# Patient Record
Sex: Female | Born: 1980 | Hispanic: No | Marital: Married | Smoking: Never smoker
Health system: Southern US, Community
[De-identification: ages and names within clinical notes are randomized; demographics above are authoritative.]

## PROBLEM LIST (undated history)

## (undated) DIAGNOSIS — D649 Anemia, unspecified: Secondary | ICD-10-CM

## (undated) HISTORY — DX: Anemia, unspecified: D64.9

## (undated) HISTORY — PX: NO PAST SURGERIES: SHX2092

---

## 2008-10-04 ENCOUNTER — Ambulatory Visit (HOSPITAL_COMMUNITY): Admission: RE | Admit: 2008-10-04 | Discharge: 2008-10-04 | Payer: Self-pay | Admitting: Obstetrics

## 2008-11-02 ENCOUNTER — Ambulatory Visit (HOSPITAL_COMMUNITY): Admission: RE | Admit: 2008-11-02 | Discharge: 2008-11-02 | Payer: Self-pay | Admitting: Obstetrics & Gynecology

## 2008-12-14 ENCOUNTER — Ambulatory Visit (HOSPITAL_COMMUNITY): Admission: RE | Admit: 2008-12-14 | Discharge: 2008-12-14 | Payer: Self-pay | Admitting: Obstetrics & Gynecology

## 2009-02-01 ENCOUNTER — Inpatient Hospital Stay (HOSPITAL_COMMUNITY): Admission: AD | Admit: 2009-02-01 | Discharge: 2009-02-04 | Payer: Self-pay | Admitting: Obstetrics & Gynecology

## 2009-02-02 ENCOUNTER — Encounter: Payer: Self-pay | Admitting: Obstetrics & Gynecology

## 2009-08-05 ENCOUNTER — Emergency Department (HOSPITAL_COMMUNITY): Admission: EM | Admit: 2009-08-05 | Discharge: 2009-08-05 | Payer: Self-pay | Admitting: Emergency Medicine

## 2010-09-17 ENCOUNTER — Encounter
Admission: RE | Admit: 2010-09-17 | Discharge: 2010-09-17 | Payer: Self-pay | Source: Home / Self Care | Attending: Infectious Diseases | Admitting: Infectious Diseases

## 2010-09-28 NOTE — L&D Delivery Note (Signed)
Delivery Note At 11:03 PM a viable female was delivered via Vaginal, Spontaneous Delivery by EMA  APGAR: 7, 7; weight 6 lb 1.2 oz (2755 g).   Placenta status: Intact, Spontaneous by RN.    Anesthesia: None  Episiotomy: None Lacerations: 2nd degree Suture Repair: 2.0 vicryl rapide Est. Blood Loss (mL): Unknown  Mom to postpartum.  Baby to nursery-stable.  JACKSON-MOORE,[name] A 09/04/2011, 12:48 AM

## 2010-12-31 LAB — CBC
HCT: 37.8 % (ref 36.0–46.0)
Hemoglobin: 12.8 g/dL (ref 12.0–15.0)
MCHC: 33.9 g/dL (ref 30.0–36.0)
MCV: 86.4 fL (ref 78.0–100.0)
Platelets: 242 10*3/uL (ref 150–400)
RBC: 4.38 MIL/uL (ref 3.87–5.11)
RDW: 13.5 % (ref 11.5–15.5)
WBC: 8.6 10*3/uL (ref 4.0–10.5)

## 2010-12-31 LAB — POCT I-STAT, CHEM 8
BUN: 16 mg/dL (ref 6–23)
Chloride: 104 mEq/L (ref 96–112)
Creatinine, Ser: 0.7 mg/dL (ref 0.4–1.2)
Sodium: 141 mEq/L (ref 135–145)
TCO2: 26 mmol/L (ref 0–100)

## 2010-12-31 LAB — TSH: TSH: 2.071 u[IU]/mL (ref 0.350–4.500)

## 2011-01-06 LAB — CBC
HCT: 31.2 % — ABNORMAL LOW (ref 36.0–46.0)
HCT: 35.8 % — ABNORMAL LOW (ref 36.0–46.0)
Hemoglobin: 12.4 g/dL (ref 12.0–15.0)
MCHC: 34.6 g/dL (ref 30.0–36.0)
MCHC: 34.8 g/dL (ref 30.0–36.0)
MCV: 88.7 fL (ref 78.0–100.0)
MCV: 88.9 fL (ref 78.0–100.0)
Platelets: 209 10*3/uL (ref 150–400)
RBC: 4.03 MIL/uL (ref 3.87–5.11)
RDW: 15.1 % (ref 11.5–15.5)

## 2011-01-06 LAB — RPR: RPR Ser Ql: NONREACTIVE

## 2011-09-02 ENCOUNTER — Other Ambulatory Visit: Payer: Self-pay | Admitting: Obstetrics & Gynecology

## 2011-09-02 ENCOUNTER — Ambulatory Visit (HOSPITAL_COMMUNITY)
Admission: RE | Admit: 2011-09-02 | Discharge: 2011-09-02 | Disposition: A | Discharge status: Home / Self Care | Payer: Medicaid Other | Source: Ambulatory Visit | Attending: Obstetrics & Gynecology | Admitting: Obstetrics & Gynecology

## 2011-09-02 DIAGNOSIS — O368390 Maternal care for abnormalities of the fetal heart rate or rhythm, unspecified trimester, not applicable or unspecified: Secondary | ICD-10-CM | POA: Insufficient documentation

## 2011-09-02 DIAGNOSIS — O288 Other abnormal findings on antenatal screening of mother: Secondary | ICD-10-CM

## 2011-09-02 DIAGNOSIS — O368190 Decreased fetal movements, unspecified trimester, not applicable or unspecified: Secondary | ICD-10-CM

## 2011-09-03 ENCOUNTER — Inpatient Hospital Stay (HOSPITAL_COMMUNITY)
Admission: AD | Admit: 2011-09-03 | Discharge: 2011-09-06 | DRG: 775 | Disposition: A | Discharge status: Home / Self Care | Payer: Medicaid Other | Source: Ambulatory Visit | Attending: Obstetrics & Gynecology | Admitting: Obstetrics & Gynecology

## 2011-09-03 ENCOUNTER — Inpatient Hospital Stay (HOSPITAL_COMMUNITY)
Admission: AD | Admit: 2011-09-03 | Discharge: 2011-09-03 | Disposition: A | Discharge status: Home / Self Care | Payer: Medicaid Other | Source: Ambulatory Visit | Attending: Obstetrics & Gynecology | Admitting: Obstetrics & Gynecology

## 2011-09-03 ENCOUNTER — Encounter (HOSPITAL_COMMUNITY): Payer: Self-pay | Admitting: *Deleted

## 2011-09-03 DIAGNOSIS — O479 False labor, unspecified: Secondary | ICD-10-CM | POA: Insufficient documentation

## 2011-09-03 MED ORDER — ZOLPIDEM TARTRATE 10 MG PO TABS
10.0000 mg | ORAL_TABLET | Freq: Once | ORAL | Status: AC
  Administered 2011-09-03: 10 mg via ORAL
  Filled 2011-09-03: qty 1

## 2011-09-03 NOTE — Progress Notes (Signed)
38.4 WKS G3P2 SVE 3/80/-3 UC'S X2 IN 30 MIN. REACTIVE STRIP WITH X2 EARLIES. ORDERS GIVEN TO D/C HOME WITH LABOR AND KICK COUNTS. ORDERS GIVEN FOR AMBIEN 10 MG PO FOR PAIN.

## 2011-09-04 ENCOUNTER — Encounter (HOSPITAL_COMMUNITY): Payer: Self-pay

## 2011-09-04 LAB — CBC
HCT: 33.7 % — ABNORMAL LOW (ref 36.0–46.0)
MCHC: 32.9 g/dL (ref 30.0–36.0)
MCV: 80.2 fL (ref 78.0–100.0)
Platelets: 242 10*3/uL (ref 150–400)
RDW: 17.4 % — ABNORMAL HIGH (ref 11.5–15.5)
WBC: 19.8 10*3/uL — ABNORMAL HIGH (ref 4.0–10.5)

## 2011-09-04 MED ORDER — DIPHENHYDRAMINE HCL 25 MG PO CAPS: 25.0000 mg | ORAL_CAPSULE | Freq: Four times a day (QID) | ORAL | Status: DC | PRN

## 2011-09-04 MED ORDER — SENNOSIDES-DOCUSATE SODIUM 8.6-50 MG PO TABS
2.0000 | ORAL_TABLET | Freq: Every day | ORAL | Status: DC
  Administered 2011-09-04 – 2011-09-05 (×2): 2 via ORAL

## 2011-09-04 MED ORDER — DIBUCAINE 1 % RE OINT: 1.0000 "application " | TOPICAL_OINTMENT | RECTAL | Status: DC | PRN

## 2011-09-04 MED ORDER — MEASLES, MUMPS & RUBELLA VAC ~~LOC~~ INJ: 0.5000 mL | INJECTION | Freq: Once | SUBCUTANEOUS | Status: DC

## 2011-09-04 MED ORDER — LIDOCAINE HCL (PF) 1 % IJ SOLN: 30.0000 mL | INTRAMUSCULAR | Status: DC | PRN

## 2011-09-04 MED ORDER — ONDANSETRON HCL 4 MG PO TABS: 4.0000 mg | ORAL_TABLET | ORAL | Status: DC | PRN

## 2011-09-04 MED ORDER — LACTATED RINGERS IV SOLN: INTRAVENOUS | Status: DC

## 2011-09-04 MED ORDER — TETANUS-DIPHTH-ACELL PERTUSSIS 5-2.5-18.5 LF-MCG/0.5 IM SUSP: 0.5000 mL | Freq: Once | INTRAMUSCULAR | Status: DC

## 2011-09-04 MED ORDER — LACTATED RINGERS IV SOLN: 500.0000 mL | INTRAVENOUS | Status: DC | PRN

## 2011-09-04 MED ORDER — OXYTOCIN 10 UNIT/ML IJ SOLN
10.0000 [IU] | Freq: Once | INTRAMUSCULAR | Status: AC
  Administered 2011-09-04: 10 [IU] via INTRAMUSCULAR

## 2011-09-04 MED ORDER — MAGNESIUM HYDROXIDE 400 MG/5ML PO SUSP: 30.0000 mL | ORAL | Status: DC | PRN

## 2011-09-04 MED ORDER — OXYTOCIN 10 UNIT/ML IJ SOLN
INTRAMUSCULAR | Status: AC
  Filled 2011-09-04: qty 1

## 2011-09-04 MED ORDER — VARICELLA VIRUS VACCINE LIVE 1350 PFU/0.5ML IJ SUSR: 0.5000 mL | INTRAMUSCULAR | Status: DC | PRN

## 2011-09-04 MED ORDER — FERROUS SULFATE 325 (65 FE) MG PO TABS
325.0000 mg | ORAL_TABLET | Freq: Two times a day (BID) | ORAL | Status: DC
  Administered 2011-09-04 – 2011-09-06 (×3): 325 mg via ORAL
  Filled 2011-09-04 (×3): qty 1

## 2011-09-04 MED ORDER — OXYCODONE-ACETAMINOPHEN 5-325 MG PO TABS: 2.0000 | ORAL_TABLET | ORAL | Status: DC | PRN

## 2011-09-04 MED ORDER — WITCH HAZEL-GLYCERIN EX PADS: 1.0000 "application " | MEDICATED_PAD | CUTANEOUS | Status: DC | PRN

## 2011-09-04 MED ORDER — CITRIC ACID-SODIUM CITRATE 334-500 MG/5ML PO SOLN: 30.0000 mL | ORAL | Status: DC | PRN

## 2011-09-04 MED ORDER — OXYCODONE-ACETAMINOPHEN 5-325 MG PO TABS: 1.0000 | ORAL_TABLET | ORAL | Status: DC | PRN

## 2011-09-04 MED ORDER — HYDROXYZINE HCL 50 MG PO TABS: 50.0000 mg | ORAL_TABLET | Freq: Four times a day (QID) | ORAL | Status: DC | PRN

## 2011-09-04 MED ORDER — INFLUENZA VIRUS VACC SPLIT PF IM SUSP: 0.5000 mL | INTRAMUSCULAR | Status: DC | PRN

## 2011-09-04 MED ORDER — ONDANSETRON HCL 4 MG/2ML IJ SOLN: 4.0000 mg | INTRAMUSCULAR | Status: DC | PRN

## 2011-09-04 MED ORDER — BENZOCAINE-MENTHOL 20-0.5 % EX AERO
INHALATION_SPRAY | CUTANEOUS | Status: AC
  Administered 2011-09-04: 1 via TOPICAL
  Filled 2011-09-04: qty 56

## 2011-09-04 MED ORDER — PRENATAL PLUS 27-1 MG PO TABS
1.0000 | ORAL_TABLET | Freq: Every day | ORAL | Status: DC
  Administered 2011-09-04 – 2011-09-06 (×3): 1 via ORAL
  Filled 2011-09-04 (×3): qty 1

## 2011-09-04 MED ORDER — BUTORPHANOL TARTRATE 2 MG/ML IJ SOLN: 1.0000 mg | INTRAMUSCULAR | Status: DC | PRN

## 2011-09-04 MED ORDER — IBUPROFEN 600 MG PO TABS: 600.0000 mg | ORAL_TABLET | Freq: Four times a day (QID) | ORAL | Status: DC | PRN

## 2011-09-04 MED ORDER — HYDROXYZINE HCL 50 MG/ML IM SOLN: 50.0000 mg | Freq: Four times a day (QID) | INTRAMUSCULAR | Status: DC | PRN

## 2011-09-04 MED ORDER — LANOLIN HYDROUS EX OINT: TOPICAL_OINTMENT | CUTANEOUS | Status: DC | PRN

## 2011-09-04 MED ORDER — ONDANSETRON HCL 4 MG/2ML IJ SOLN: 4.0000 mg | Freq: Four times a day (QID) | INTRAMUSCULAR | Status: DC | PRN

## 2011-09-04 MED ORDER — BENZOCAINE-MENTHOL 20-0.5 % EX AERO
1.0000 "application " | INHALATION_SPRAY | CUTANEOUS | Status: DC | PRN
  Administered 2011-09-04: 1 via TOPICAL

## 2011-09-04 MED ORDER — ACETAMINOPHEN 325 MG PO TABS: 650.0000 mg | ORAL_TABLET | ORAL | Status: DC | PRN

## 2011-09-04 MED ORDER — IBUPROFEN 600 MG PO TABS
600.0000 mg | ORAL_TABLET | Freq: Four times a day (QID) | ORAL | Status: DC
  Administered 2011-09-04 – 2011-09-06 (×10): 600 mg via ORAL
  Filled 2011-09-04 (×9): qty 1

## 2011-09-04 MED ORDER — LIDOCAINE HCL (PF) 1 % IJ SOLN
30.0000 mL | Freq: Once | INTRAMUSCULAR | Status: AC
  Administered 2011-09-04: 30 mL
  Filled 2011-09-04: qty 30

## 2011-09-04 MED ORDER — MEDROXYPROGESTERONE ACETATE 150 MG/ML IM SUSP: 150.0000 mg | INTRAMUSCULAR | Status: DC | PRN

## 2011-09-04 NOTE — Progress Notes (Signed)
UR Chart review completed.  

## 2011-09-04 NOTE — H&P (Signed)
Jessica Ballard is a 30 y.o. female presenting s/p NSVD. Maternal Medical History:  Reason for admission: Reason for Admission:   nauseaBIBEMS s/p NSVD  Prenatal complications: no prenatal complications   OB History    Grav Para Term Preterm Abortions TAB SAB Ect Mult Living   3 3 3       3      Past Medical History  Diagnosis Date  . No pertinent past medical history    Past Surgical History  Procedure Date  . No past surgeries    Family History: family history is not on file. Social History:  reports that she has never smoked. She does not have any smokeless tobacco history on file. She reports that she does not drink alcohol or use illicit drugs.  Review of Systems  Constitutional: Negative for fever.  Eyes: Negative for blurred vision.  Respiratory: Negative for shortness of breath.   Gastrointestinal: Negative for nausea and vomiting.  Skin: Negative for rash.  Neurological: Negative for headaches.      Blood pressure 113/92, pulse 82, resp. rate 20, unknown if currently breastfeeding. Maternal Exam:  Introitus: See delivery note.     Physical Exam  Constitutional: She appears well-developed.  HENT:  Head: Normocephalic.  Neck: Neck supple. No thyromegaly present.  Cardiovascular: Normal rate and regular rhythm.   Respiratory: Breath sounds normal.  GI: Soft. Bowel sounds are normal.  Skin: No rash noted.    Prenatal labs: ABO, Rh:   Antibody:   Rubella:   RPR:    HBsAg:    HIV:    GBS:     Assessment/Plan: Multipara at term s/p NSVD. Mother/Infant stable  Admit Routine postpartum care   JACKSON-MOORE,Jarrid Lienhard A 09/04/2011, 12:51 AM

## 2011-09-04 NOTE — Progress Notes (Signed)
Post Partum Day 1 Subjective: no complaints, up ad lib, voiding, tolerating PO, + flatus and breastfeeding.   Objective: Blood pressure 96/57, pulse 94, temperature 97.8 F (36.6 C), temperature source Oral, resp. rate 16, unknown if currently breastfeeding.  Physical Exam:  General: alert, cooperative, appears stated age and no distress Lochia: appropriate Uterine Fundus: firm Incision: healing well, no significant drainage, no dehiscence, no significant erythema (perineal laceration). DVT Evaluation: No evidence of DVT seen on physical exam. Negative Homan's sign. No cords or calf tenderness.   Basename 09/04/11 0537  HGB 11.1*  HCT 33.7*    Assessment/Plan: Plan for discharge tomorrow and Breastfeeding   LOS: 1 day   Anice Paganini CNM 09/04/2011, 11:09 AM

## 2011-09-05 NOTE — Discharge Summary (Signed)
Obstetric Discharge Summary Reason for Admission: onset of labor Prenatal Procedures: none Intrapartum Procedures: spontaneous vaginal delivery Postpartum Procedures: none Complications-Operative and Postpartum: none Hemoglobin  Date Value Range Status  09/04/2011 11.1* 12.0-15.0 (g/dL) Final     HCT  Date Value Range Status  09/04/2011 33.7* 36.0-46.0 (%) Final    Discharge Diagnoses: Term Pregnancy-delivered  Discharge Information: Date: 09/05/2011 Activity: pelvic rest Diet: routine Medications: Percocet Condition: stable Instructions: refer to practice specific booklet Discharge to: home Follow-up Information    Follow up with Antionette Char A, MD. Call in 6 weeks.   Contact information:   [address], [address] [place] [address] [phone]          Newborn Data: Live born female  Birth Weight: 6 lb 1.2 oz (2755 g) APGAR: 7, 7  Home with mother.  Jessica Ballard A 09/05/2011, 8:15 AM

## 2011-09-06 NOTE — Discharge Summary (Signed)
  Postpartum day 3 Vital signs will Fundus firm Legs negative Discharge was canceled yesterday because butoconazole him

## 2011-09-08 ENCOUNTER — Encounter (HOSPITAL_COMMUNITY): Payer: Self-pay | Admitting: *Deleted

## 2012-01-06 ENCOUNTER — Ambulatory Visit (INDEPENDENT_AMBULATORY_CARE_PROVIDER_SITE_OTHER): Payer: Self-pay | Admitting: Family Medicine

## 2012-01-06 ENCOUNTER — Encounter: Payer: Self-pay | Admitting: Family Medicine

## 2012-01-06 VITALS — BP 102/69 | HR 70 | Temp 97.7°F | Ht 66.0 in | Wt 114.0 lb

## 2012-01-06 DIAGNOSIS — Z833 Family history of diabetes mellitus: Secondary | ICD-10-CM

## 2012-01-06 DIAGNOSIS — L989 Disorder of the skin and subcutaneous tissue, unspecified: Secondary | ICD-10-CM | POA: Insufficient documentation

## 2012-01-06 DIAGNOSIS — D170 Benign lipomatous neoplasm of skin and subcutaneous tissue of head, face and neck: Secondary | ICD-10-CM

## 2012-01-06 DIAGNOSIS — D1739 Benign lipomatous neoplasm of skin and subcutaneous tissue of other sites: Secondary | ICD-10-CM

## 2012-01-06 DIAGNOSIS — L659 Nonscarring hair loss, unspecified: Secondary | ICD-10-CM

## 2012-01-06 DIAGNOSIS — R42 Dizziness and giddiness: Secondary | ICD-10-CM

## 2012-01-06 HISTORY — DX: Disorder of the skin and subcutaneous tissue, unspecified: L98.9

## 2012-01-06 NOTE — Assessment & Plan Note (Addendum)
Hairloss for many years. Normal exam. No scarring or brittleness. Negative pull test. Most likely subjective but will obtain CBC, CMET and TSH to evaluate nutritional status and thyroid function.

## 2012-01-06 NOTE — Assessment & Plan Note (Signed)
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

## 2012-01-06 NOTE — Assessment & Plan Note (Signed)
Normal screening test during pregnancies. Plan to check fasting blood sugar on CMET when patient returns for blood draw.

## 2012-01-06 NOTE — Assessment & Plan Note (Signed)
Normal exam and orthostatic vital signs. Suspect anemia may have worsened. Patient with documented normocytic anemia in 12/12. Taking PNV with iron. Will check anemia panel.

## 2012-01-06 NOTE — Patient Instructions (Signed)
Jessica Ballard,   Thank you for coming in to see me today. It was a pleasure to meet you.  Please return at your earliest convenience for a lab visit for blood work.   After you have the blood work done I will call you with the results/send a letter if all normal.   For now: continue your vitamin, rest, daily exercise to reduce stress (15 mins) and consume a well rounded diet.   Dr. Armen Pickup

## 2012-01-06 NOTE — Progress Notes (Signed)
Subjective:     Patient ID: Jessica Ballard, female   DOB: October 14, 1980, 31 y.o.   MRN: [id number]  HPI 31 yo new patient presents to establish primary care and to discuss the following issues:  1. Lumps in head: painless lumps x 2 on L side of head. Present for 10 years unchanging. Pain less.  2. Lightheaded and weak x 3-4 days: admits to poor appetite, fatigue, feeling intermittently lightheaded and dizzy and not having time to rest with 3 young children and working at her [employer]. Denies visual changes, CP, palpitations, SOB, and vaginal bleeding.  3. Hair loss: x 2 years. Thinning hair. No worse or better with pregnancy. No family history of alopecia in close female relative. No nail changes (thinning or brittle). Uses Vaseline and oils as products. Denies use of harsh chemicals.  4 Last received care at [facility]  6 week postpartum visit on 11/04/11. Breastfeeding. Bonding well with baby. Takes PNV every other day due to constipation.  5. Family Hx of DM2: normal 1 hr glucola test with pregnancies. No polyuria, polydipsia, weight changes.   Past Medical History  Diagnosis Date  . Anemia     Review of Systems As per HPI    Objective:   Physical Exam BP 102/69  Pulse 70  Temp(Src) 97.7 F (36.5 C) (Oral)  Ht 5\' 6"  (1.676 m)  Wt 114 lb (51.71 kg)  BMI 18.40 kg/m2  Breastfeeding? Yes General appearance: alert, cooperative and no distress Head: Normocephalic, without obvious abnormality, atraumatic, two 1x1 cm firm, immobile, nontender scalp nodules in L parietal scalp. Negative pull test (no hair came out with pulling).  Neck: no adenopathy, no carotid bruit, no JVD, supple, symmetrical, trachea midline and thyroid not enlarged, symmetric, no tenderness/mass/nodules Lungs: clear to auscultation bilaterally Heart: regular rate and rhythm, S1, S2 normal, no murmur, click, rub or gallop Pulses: 2+ and symmetric Skin: Skin color, texture, turgor normal. No rashes or  lesions Neurologic: Grossly normal    Assessment and Plan:  See Problem List

## 2012-02-12 ENCOUNTER — Telehealth: Payer: Self-pay | Admitting: Family Medicine

## 2012-02-12 NOTE — Telephone Encounter (Signed)
Patient states she started with fever, back ache, teeth hurt, headache. She has not actually taken temp but feels feverish .  Advised that she probably needs to be seen to be evaluated and recommended she go to Urgent Care . States she does not want to be seen  just wants to know what she can take for fever and pain. Recommened to take Tylenol. Make sure drinking plenty of fluids.    States the baby had temp yesterday of 100 axillary but none today.  Drinking well. Has a runny nose.  Advised if develops fever needs to be seen.Marland Kitchen

## 2012-02-12 NOTE — Telephone Encounter (Signed)
Pt called to say she has the flu and is breast feeding.  Wants to know what she can take. She states that her 19 mo old has it also.

## 2013-01-12 ENCOUNTER — Encounter: Payer: Medicaid Other | Admitting: Family Medicine

## 2013-01-27 ENCOUNTER — Encounter: Payer: Medicaid Other | Admitting: Family Medicine

## 2013-01-30 ENCOUNTER — Encounter: Payer: Medicaid Other | Admitting: Family Medicine

## 2013-03-10 ENCOUNTER — Encounter: Payer: Medicaid Other | Admitting: Family Medicine

## 2013-03-14 ENCOUNTER — Ambulatory Visit (INDEPENDENT_AMBULATORY_CARE_PROVIDER_SITE_OTHER): Payer: Medicaid Other | Admitting: Family Medicine

## 2013-03-14 ENCOUNTER — Encounter: Payer: Self-pay | Admitting: Family Medicine

## 2013-03-14 VITALS — BP 107/75 | HR 73 | Ht 66.25 in | Wt 107.0 lb

## 2013-03-14 DIAGNOSIS — Z833 Family history of diabetes mellitus: Secondary | ICD-10-CM

## 2013-03-14 DIAGNOSIS — R636 Underweight: Secondary | ICD-10-CM

## 2013-03-14 DIAGNOSIS — Z30011 Encounter for initial prescription of contraceptive pills: Secondary | ICD-10-CM | POA: Insufficient documentation

## 2013-03-14 DIAGNOSIS — R634 Abnormal weight loss: Secondary | ICD-10-CM

## 2013-03-14 DIAGNOSIS — Z3009 Encounter for other general counseling and advice on contraception: Secondary | ICD-10-CM

## 2013-03-14 DIAGNOSIS — D509 Iron deficiency anemia, unspecified: Secondary | ICD-10-CM

## 2013-03-14 DIAGNOSIS — L659 Nonscarring hair loss, unspecified: Secondary | ICD-10-CM

## 2013-03-14 HISTORY — DX: Abnormal weight loss: R63.4

## 2013-03-14 LAB — IBC PANEL
%SAT: 4 % — ABNORMAL LOW (ref 20–55)
UIBC: 395 ug/dL (ref 125–400)

## 2013-03-14 LAB — CBC
MCV: 81.8 fL (ref 78.0–100.0)
Platelets: 215 10*3/uL (ref 150–400)
RBC: 4.44 MIL/uL (ref 3.87–5.11)
WBC: 4.9 10*3/uL (ref 4.0–10.5)

## 2013-03-14 LAB — FERRITIN: Ferritin: 2 ng/mL — ABNORMAL LOW (ref 10–291)

## 2013-03-14 MED ORDER — ETONOGESTREL-ETHINYL ESTRADIOL 0.12-0.015 MG/24HR VA RING: VAGINAL_RING | VAGINAL | Status: DC

## 2013-03-14 NOTE — Progress Notes (Signed)
Subjective:     Patient ID: Jessica Ballard, female   DOB: 05-03-1981, 32 y.o.   MRN: [id number]  HPI 32 year old female presents with her husband for a physical and to discuss the following:  #1 Hair loss: Patient is here for the past year. The house is painless and not associated with scarring. She has no known family history of health. Her menstrual periods have been normal and she has no female pattern hair growth (facial,  chest or back hair).   #2 weight loss: Patient has experienced 7 pounds unintentional weight loss since her last office visit 2 months ago. In this time she has started working at [employer]. She reports her appetite is good but she just does not have time to eat.  #3 contraception: Patient currently takes active with her husband. She's not using contraception. She is interested in options does not want anything injected or inserted.  #4 health maintenance: Last Pap smear was 2 years ago normal.  Review of Systems As per HPI  Patient Information Form: Screening and ROS  AUDIT-C Score: 0 Do you feel safe in relationships? yes PHQ-2:negative  Review of Symptoms  General:  Positive for unexplained weight loss. Negative for fever Skin: Negative for new or changing mole, sore that won't heal HEENT: Negative for trouble hearing, trouble seeing, ringing in ears, mouth sores, hoarseness, change in voice, dysphagia. CV:  Negative for chest pain, dyspnea, edema, palpitations Resp: Negative for cough, dyspnea, hemoptysis GI: Negative for nausea, vomiting, diarrhea, constipation, abdominal pain, melena, hematochezia. GU: Negative for dysuria, incontinence, urinary hesitance, hematuria, vaginal or penile discharge, polyuria, sexual difficulty, lumps in testicle or breasts MSK: Negative for muscle cramps or aches, joint pain or swelling Neuro: Negative for headaches, weakness, numbness, dizziness, passing out/fainting Psych: Negative for depression, anxiety, memory problems     Objective:   Physical Exam BP 107/75  Pulse 73  Ht 5' 6.25" (1.683 m)  Wt 107 lb (48.535 kg)  BMI 17.14 kg/m2 General appearance: alert, cooperative and no distress Head: Normocephalic, without obvious abnormality, atraumatic hair thinning most notable at L>R posterior scalp. Negative pull test (no hair came out with pulling).  Neck: no adenopathy, no carotid bruit, no JVD, supple, symmetrical, trachea midline and thyroid not enlarged, symmetric, no tenderness/mass/nodules Lungs: clear to auscultation bilaterally Heart: regular rate and rhythm, S1, S2 normal, no murmur, click, rub or gallop Extremities: extremities normal, atraumatic, no cyanosis or edema    Assessment and Plan:

## 2013-03-14 NOTE — Patient Instructions (Signed)
Jessica Ballard,  Thank you for coming in today. Please start nuvaring for birth control.  I will be in touch with blood work and let you know if you need supplements.   Dr. Armen Pickup

## 2013-03-17 ENCOUNTER — Telehealth: Payer: Self-pay | Admitting: Family Medicine

## 2013-03-17 ENCOUNTER — Encounter: Payer: Self-pay | Admitting: Family Medicine

## 2013-03-17 DIAGNOSIS — R636 Underweight: Secondary | ICD-10-CM

## 2013-03-17 DIAGNOSIS — D509 Iron deficiency anemia, unspecified: Secondary | ICD-10-CM | POA: Insufficient documentation

## 2013-03-17 HISTORY — DX: Underweight: R63.6

## 2013-03-17 MED ORDER — FERROUS SULFATE 325 (65 FE) MG PO TABS: 325.0000 mg | ORAL_TABLET | Freq: Three times a day (TID) | ORAL | Status: DC

## 2013-03-17 NOTE — Telephone Encounter (Signed)
Called patient left brief voice mail. The patient has a iron deficiency. Prescribed oral iron therapy. I have sent a letter detailing my instructions.  Patient advised to call back if she has questions once she receives the letter.

## 2013-03-17 NOTE — Assessment & Plan Note (Signed)
A: normal TSH. IDA, suspect chronic IDA is contributing to hair loss. P: replete iron.

## 2013-03-17 NOTE — Telephone Encounter (Signed)
Pt has questions about blood work, she would like a copy of the blood work Please  call

## 2013-03-17 NOTE — Assessment & Plan Note (Signed)
Screening A1c is normal.

## 2013-03-17 NOTE — Assessment & Plan Note (Signed)
A: Jessica Ballard. Suspect chronic Jessica Ballard. Patient reports that she did require iron supplementation during her last pregnancy. P: I will replete her iron orally. A modest her rate response to oral supplementation by checking a reticulocyte count in the CBC in 8-12 weeks. I suspect that her hair loss is also in part due to chronic iron deficiency anemia.

## 2013-03-17 NOTE — Telephone Encounter (Signed)
Someone called patient per patient but she could not hear the message.  Please call patient with lab results.  She has lots of questions.  Saylor Sheckler, Darlyne Russian, CMA

## 2013-03-17 NOTE — Assessment & Plan Note (Signed)
A: patient has increased work and is skipping meals. P: we discussed plan for packing meals vs taking meal supplements when she is short on time.

## 2013-03-17 NOTE — Assessment & Plan Note (Signed)
A: discussed options P: nuva ring

## 2013-03-20 NOTE — Telephone Encounter (Signed)
Called patient back. Reiterated info from last voicemail. Informed her that results letter sent. She is to call with questions.

## 2014-07-30 ENCOUNTER — Encounter: Payer: Self-pay | Admitting: Family Medicine

## 2015-03-27 ENCOUNTER — Encounter (HOSPITAL_COMMUNITY): Payer: Self-pay | Admitting: Family Medicine

## 2015-03-27 ENCOUNTER — Emergency Department (HOSPITAL_COMMUNITY)
Admission: EM | Admit: 2015-03-27 | Discharge: 2015-03-27 | Disposition: A | Discharge status: Home / Self Care | Payer: 59 | Attending: Emergency Medicine | Admitting: Emergency Medicine

## 2015-03-27 DIAGNOSIS — Y998 Other external cause status: Secondary | ICD-10-CM | POA: Insufficient documentation

## 2015-03-27 DIAGNOSIS — Z880 Allergy status to penicillin: Secondary | ICD-10-CM | POA: Insufficient documentation

## 2015-03-27 DIAGNOSIS — Y9389 Activity, other specified: Secondary | ICD-10-CM | POA: Insufficient documentation

## 2015-03-27 DIAGNOSIS — Z79899 Other long term (current) drug therapy: Secondary | ICD-10-CM | POA: Insufficient documentation

## 2015-03-27 DIAGNOSIS — T63481A Toxic effect of venom of other arthropod, accidental (unintentional), initial encounter: Secondary | ICD-10-CM | POA: Diagnosis not present

## 2015-03-27 DIAGNOSIS — R21 Rash and other nonspecific skin eruption: Secondary | ICD-10-CM | POA: Diagnosis present

## 2015-03-27 DIAGNOSIS — Y9289 Other specified places as the place of occurrence of the external cause: Secondary | ICD-10-CM | POA: Insufficient documentation

## 2015-03-27 DIAGNOSIS — T7840XA Allergy, unspecified, initial encounter: Secondary | ICD-10-CM | POA: Insufficient documentation

## 2015-03-27 DIAGNOSIS — X58XXXA Exposure to other specified factors, initial encounter: Secondary | ICD-10-CM | POA: Insufficient documentation

## 2015-03-27 MED ORDER — EPINEPHRINE 0.3 MG/0.3ML IJ SOAJ: 0.3000 mg | Freq: Once | INTRAMUSCULAR | Status: DC

## 2015-03-27 NOTE — ED Notes (Signed)
Pt here for RLE redness and swelling. sts she was in her garden and felt something sting her. Now pt having redness and swelling from right mid calf to foot and swelling.

## 2015-03-27 NOTE — Discharge Instructions (Signed)

## 2015-03-27 NOTE — ED Provider Notes (Signed)
CSN: [id number]     Arrival date & time 03/27/15  1610 History   First MD Initiated Contact with Patient 03/27/15 1725     Chief Complaint  Patient presents with  . Leg Pain  . Insect Bite     (Consider location/radiation/quality/duration/timing/severity/associated sxs/prior Treatment) Patient is a 34 y.o. female presenting with leg pain.  Leg Pain Location:  Leg Leg location:  L leg and L lower leg Pain details:    Quality:  Aching   Radiates to:  Does not radiate   Severity:  Moderate   Onset quality:  Sudden   Duration:  2 days   Timing:  Constant   Progression:  Partially resolved Chronicity:  New Dislocation: no   Foreign body present:  No foreign bodies Tetanus status:  Out of date Prior injury to area:  No Relieved by:  Nothing Worsened by:  Nothing tried Ineffective treatments:  None tried Associated symptoms: itching (around waist and non-contact areas) and swelling   Risk factors comment:  Likely insect sting/bite   Past Medical History  Diagnosis Date  . Anemia   . Normal delivery 09/04/2011   Past Surgical History  Procedure Laterality Date  . No past surgeries     Family History  Problem Relation Age of Onset  . Diabetes Mother   . Diabetes Maternal Aunt   . Diabetes Maternal Uncle   . Diabetes Maternal Grandmother   . Diabetes Maternal Grandfather    History  Substance Use Topics  . Smoking status: Never Smoker   . Smokeless tobacco: Never Used  . Alcohol Use: No   OB History    Gravida Para Term Preterm AB TAB SAB Ectopic Multiple Living   3 3 3       3      Review of Systems  HENT:       Congestion and cough 30 minutes following initial incident  Skin: Positive for itching (around waist and non-contact areas) and rash (described hives around waist with pruritis).  All other systems reviewed and are negative.     Allergies  Penicillins and Zolpidem tartrate  Home Medications   Prior to Admission medications   Medication Sig  Start Date End Date Taking? Authorizing Provider  etonogestrel-ethinyl estradiol (NUVARING) 0.12-0.015 MG/24HR vaginal ring Insert vaginally and leave in place for 3 consecutive weeks, then remove for 1 week. 03/14/13  Yes Josalyn Funches, MD  fish oil-omega-3 fatty acids 1000 MG capsule Take 2 g by mouth daily.   Yes Historical Provider, MD  Multiple Vitamins-Minerals (MULTIVITAMIN WITH MINERALS) tablet Take 1 tablet by mouth daily.   Yes Historical Provider, MD  EPINEPHrine 0.3 mg/0.3 mL IJ SOAJ injection Inject 0.3 mLs (0.3 mg total) into the muscle once. 03/27/15   Leo Grosser, MD   BP 106/74 mmHg  Pulse 74  Temp(Src) 98 F (36.7 C) (Oral)  Resp 16  SpO2 100%  LMP 03/27/2015 Physical Exam  Constitutional: She is oriented to person, place, and time. She appears well-developed and well-nourished. No distress.  HENT:  Head: Normocephalic.  Eyes: Conjunctivae are normal.  Neck: Neck supple. No tracheal deviation present.  Cardiovascular: Normal rate and regular rhythm.   Pulmonary/Chest: Effort normal. No respiratory distress.  Abdominal: Soft. She exhibits no distension.  Musculoskeletal:       Right lower leg: She exhibits swelling (small sting wound with no indwelling stinger over posterior calf, mild eryhtma of lower leg without warmth or fluctuance).  Neurological: She is alert and oriented to  person, place, and time.  Skin: Skin is warm and dry.  Psychiatric: She has a normal mood and affect.    ED Course  Procedures (including critical care time) Labs Review Labs Reviewed - No data to display  Imaging Review No results found.   EKG Interpretation None      MDM   Final diagnoses:  Insect sting allergy, current reaction, accidental or unintentional, initial encounter   34 yo F presents with an insect bite or sting to her right mid calf. It occurred 2 days ago and a half hour following the initial incident the patient noticed a generalized urticarial rash mostly over  her waistline, had a slight cough and congestion, no frank difficulty breathing, redness and pruritus diffusely.  Since that time the patient's systemic symptoms have completely resolved, she has a local histaminergic reaction over her calf that appears to be resolving based on her clinical history. Recommended scheduling Benadryl and Zantac and offered short course of steroids but patient declined due to side effect profile, provided EpiPen for home in case of signs of anaphylaxis given the systemic nature of her initial symptoms.     Leo Grosser, MD 03/27/15 [place], MD 03/28/15 [phone]

## 2016-04-03 ENCOUNTER — Encounter: Payer: Self-pay | Admitting: Student

## 2016-04-03 ENCOUNTER — Ambulatory Visit (INDEPENDENT_AMBULATORY_CARE_PROVIDER_SITE_OTHER): Payer: BLUE CROSS/BLUE SHIELD | Admitting: Student

## 2016-04-03 ENCOUNTER — Other Ambulatory Visit (HOSPITAL_COMMUNITY)
Admission: RE | Admit: 2016-04-03 | Discharge: 2016-04-03 | Disposition: A | Discharge status: Home / Self Care | Payer: BLUE CROSS/BLUE SHIELD | Source: Ambulatory Visit | Attending: Family Medicine | Admitting: Family Medicine

## 2016-04-03 VITALS — BP 105/71 | HR 68 | Temp 98.3°F | Ht 64.0 in | Wt 112.0 lb

## 2016-04-03 DIAGNOSIS — Z1151 Encounter for screening for human papillomavirus (HPV): Secondary | ICD-10-CM | POA: Diagnosis present

## 2016-04-03 DIAGNOSIS — IMO0001 Reserved for inherently not codable concepts without codable children: Secondary | ICD-10-CM

## 2016-04-03 DIAGNOSIS — Z833 Family history of diabetes mellitus: Secondary | ICD-10-CM

## 2016-04-03 DIAGNOSIS — Z01419 Encounter for gynecological examination (general) (routine) without abnormal findings: Secondary | ICD-10-CM | POA: Diagnosis present

## 2016-04-03 DIAGNOSIS — D509 Iron deficiency anemia, unspecified: Secondary | ICD-10-CM

## 2016-04-03 DIAGNOSIS — Z0000 Encounter for general adult medical examination without abnormal findings: Secondary | ICD-10-CM | POA: Insufficient documentation

## 2016-04-03 DIAGNOSIS — Z124 Encounter for screening for malignant neoplasm of cervix: Secondary | ICD-10-CM | POA: Diagnosis not present

## 2016-04-03 HISTORY — DX: Reserved for inherently not codable concepts without codable children: IMO0001

## 2016-04-03 LAB — CBC
HCT: 36.1 % (ref 35.0–45.0)
HEMOGLOBIN: 11.8 g/dL (ref 11.7–15.5)
MCH: 26.4 pg — ABNORMAL LOW (ref 27.0–33.0)
MCHC: 32.7 g/dL (ref 32.0–36.0)
MCV: 80.8 fL (ref 80.0–100.0)
MPV: 10 fL (ref 7.5–12.5)
Platelets: 324 10*3/uL (ref 140–400)
RBC: 4.47 MIL/uL (ref 3.80–5.10)
RDW: 14.8 % (ref 11.0–15.0)
WBC: 8.5 10*3/uL (ref 3.8–10.8)

## 2016-04-03 NOTE — Progress Notes (Signed)
   Subjective:    Patient ID: Jessica Ballard, female    DOB: 01/10/1981, 35 y.o.   MRN: [id number]   CC: Physical exam  HPI: 35 y/o F for physical exam She denies any issues today. She does report feeling overwhelmed over the last two days with work and needing to take a child to the ED for a cold, but over all feels her mood has been good. Denies low mood.   Pap smear - last pap in 2012, was reported normal  Contraception - uses condoms for contraception - is not interested in having any more children currently  History of iron deficiency anemia - takes multi vitamin   PMH- none reported PSH- none Family History  Problem Relation Age of Onset  . Diabetes Mother   . Diabetes Maternal Aunt   . Diabetes Maternal Uncle   . Diabetes Maternal Grandmother   . Diabetes Maternal Grandfather    Social History  Substance Use Topics  . Smoking status: Never Smoker   . Smokeless tobacco: Never Used  . Alcohol Use: No   OB Hx [id number]  Smoking status reviewed  Review of Systems Per HPI else denies recent illness, SOB, chest pain, vaginal pain or irritation, abdominal pain, or dysuria    Objective:  BP 105/71 mmHg  Pulse 68  Temp(Src) 98.3 F (36.8 C) (Oral)  Ht 5\' 4"  (1.626 m)  Wt 112 lb (50.803 kg)  BMI 19.22 kg/m2  SpO2 100%  LMP 04/03/2016 Vitals and nursing note reviewed  General: NAD Cardiac: RRR Respiratory: CTAB, normal effort Abdomen: soft, nontender, nondistended,  Extremities: no edema or cyanosis. WWP. Skin: warm and dry, no rashes noted Neuro: alert and oriented, no focal deficits  Pelvic: Normal EGBUS, normal vaginal canal, normal cervix with no CMT, normal mobile uterus, normal adnexa with no masses, no adnexal tenderness     Assessment & Plan:    IDA (iron deficiency anemia) CBC today - will continue multi vitamin and if anemic, will consider starting iron supplements  Family history of diabetes mellitus type II CMP to screen for  DM  Contraception Currently using condoms for contraception - continue to discuss contraception as she does not wish to have more children at this time  Health care maintenance Pap smear today - will follow     Ekta Dancer A. Lincoln Brigham MD, [facility] Resident PGY-2 Pager [phone]

## 2016-04-03 NOTE — Assessment & Plan Note (Signed)
CMP to screen for DM

## 2016-04-03 NOTE — Addendum Note (Signed)
Addended by: Lianne Bushy on: 04/03/2016 03:45 PM   Modules accepted: Orders

## 2016-04-03 NOTE — Assessment & Plan Note (Signed)
CBC today - will continue multi vitamin and if anemic, will consider starting iron supplements

## 2016-04-03 NOTE — Assessment & Plan Note (Signed)
Pap smear today - will follow  

## 2016-04-03 NOTE — Patient Instructions (Signed)
Follow up as needed If you desire to use contraception, call the office to make an appointment If you have questions or concerns, call the office at [phone]

## 2016-04-03 NOTE — Assessment & Plan Note (Signed)
Currently using condoms for contraception - continue to discuss contraception as she does not wish to have more children at this time

## 2016-04-04 LAB — COMPREHENSIVE METABOLIC PANEL
ALK PHOS: 48 U/L (ref 33–115)
ALT: 17 U/L (ref 6–29)
AST: 23 U/L (ref 10–30)
Albumin: 4.3 g/dL (ref 3.6–5.1)
BUN: 14 mg/dL (ref 7–25)
CALCIUM: 9.1 mg/dL (ref 8.6–10.2)
CO2: 21 mmol/L (ref 20–31)
Chloride: 107 mmol/L (ref 98–110)
Creat: 0.81 mg/dL (ref 0.50–1.10)
Glucose, Bld: 79 mg/dL (ref 65–99)
POTASSIUM: 4.1 mmol/L (ref 3.5–5.3)
Sodium: 139 mmol/L (ref 135–146)
TOTAL PROTEIN: 7.2 g/dL (ref 6.1–8.1)
Total Bilirubin: 0.4 mg/dL (ref 0.2–1.2)

## 2016-04-06 LAB — CYTOLOGY - PAP

## 2017-02-25 ENCOUNTER — Encounter: Payer: Self-pay | Admitting: Student

## 2017-02-25 ENCOUNTER — Ambulatory Visit (INDEPENDENT_AMBULATORY_CARE_PROVIDER_SITE_OTHER): Payer: BLUE CROSS/BLUE SHIELD | Admitting: Student

## 2017-02-25 VITALS — BP 110/62 | HR 89 | Temp 98.8°F | Wt 110.0 lb

## 2017-02-25 DIAGNOSIS — R5383 Other fatigue: Secondary | ICD-10-CM | POA: Diagnosis not present

## 2017-02-25 HISTORY — DX: Other fatigue: R53.83

## 2017-02-25 NOTE — Assessment & Plan Note (Signed)
Fatigue of unclear origin. She does have interrupted sleep from her children which is likely contributing - TSH, CBC, BMP - discussed sleep hygiene - follow after labs results and after trying improving sleep hygiene

## 2017-02-25 NOTE — Progress Notes (Signed)
   Subjective:    Patient ID: Jessica Ballard, female    DOB: January 01, 1981, 36 y.o.   MRN: [id number]   CC: fatigue  HPI: 36 y/o F presents for fatigue  Fatigue - has been present for the last several months - no weakness, recent illness or fevers - she does report that hr children wake her up from sleep a lot so she has difficulty sleeping through the night - Patient's last menstrual period was 02/10/2017.   Smoking status reviewed  Review of Systems  Per HPI, else denies chest pain, shortness of breath, abdominal pain, N/V/D,     Objective:  BP 110/62   Pulse 89   Temp 98.8 F (37.1 C) (Oral)   Wt 110 lb (49.9 kg)   LMP 02/10/2017   SpO2 98%   BMI 18.88 kg/m  Vitals and nursing note reviewed  General: NAD Cardiac: RRR, Respiratory: CTAB, normal effort Abdomen: soft, nontender, nondistended, no hepatic or splenomegaly. Bowel sounds present Skin: warm and dry, no rashes noted Neuro: alert and oriented, no focal deficits   Assessment & Plan:    Fatigue Fatigue of unclear origin. She does have interrupted sleep from her children which is likely contributing - TSH, CBC, BMP - discussed sleep hygiene - follow after labs results and after trying improving sleep hygiene    Ravonda Brecheen A. Lincoln Brigham MD, [facility] Resident PGY-3 Pager [phone]

## 2017-02-25 NOTE — Patient Instructions (Signed)
Follow up in the next 2-3 weeks Try to sleep well each night Call the office with questions or concerns

## 2017-02-26 ENCOUNTER — Encounter: Payer: Self-pay | Admitting: Student

## 2017-02-26 LAB — CBC
HEMATOCRIT: 33.2 % — AB (ref 34.0–46.6)
HEMOGLOBIN: 10.4 g/dL — AB (ref 11.1–15.9)
MCH: 24.6 pg — ABNORMAL LOW (ref 26.6–33.0)
MCHC: 31.3 g/dL — AB (ref 31.5–35.7)
MCV: 79 fL (ref 79–97)
Platelets: 254 10*3/uL (ref 150–379)
RBC: 4.23 x10E6/uL (ref 3.77–5.28)
RDW: 15 % (ref 12.3–15.4)
WBC: 6.8 10*3/uL (ref 3.4–10.8)

## 2017-02-26 LAB — BASIC METABOLIC PANEL
BUN/Creatinine Ratio: 15 (ref 9–23)
BUN: 12 mg/dL (ref 6–20)
CO2: 23 mmol/L (ref 18–29)
CREATININE: 0.81 mg/dL (ref 0.57–1.00)
Calcium: 8.7 mg/dL (ref 8.7–10.2)
Chloride: 104 mmol/L (ref 96–106)
GFR calc Af Amer: 107 mL/min/{1.73_m2} (ref >59)
GFR, EST NON AFRICAN AMERICAN: 93 mL/min/{1.73_m2} (ref >59)
GLUCOSE: 98 mg/dL (ref 65–99)
Potassium: 4.7 mmol/L (ref 3.5–5.2)
SODIUM: 137 mmol/L (ref 134–144)

## 2017-02-26 LAB — TSH: TSH: 1.13 u[IU]/mL (ref 0.450–4.500)

## 2017-10-01 ENCOUNTER — Encounter: Payer: BLUE CROSS/BLUE SHIELD | Admitting: Family Medicine

## 2017-11-09 ENCOUNTER — Ambulatory Visit: Payer: BLUE CROSS/BLUE SHIELD | Admitting: Internal Medicine

## 2019-05-12 ENCOUNTER — Encounter: Payer: BLUE CROSS/BLUE SHIELD | Admitting: Family Medicine

## 2019-07-18 ENCOUNTER — Encounter: Payer: Self-pay | Admitting: *Deleted

## 2019-08-01 ENCOUNTER — Other Ambulatory Visit: Payer: Self-pay

## 2019-08-01 ENCOUNTER — Ambulatory Visit (INDEPENDENT_AMBULATORY_CARE_PROVIDER_SITE_OTHER): Payer: Medicaid Other | Admitting: *Deleted

## 2019-08-01 DIAGNOSIS — O0990 Supervision of high risk pregnancy, unspecified, unspecified trimester: Secondary | ICD-10-CM

## 2019-08-01 DIAGNOSIS — O09529 Supervision of elderly multigravida, unspecified trimester: Secondary | ICD-10-CM | POA: Insufficient documentation

## 2019-08-01 MED ORDER — BLOOD PRESSURE KIT DEVI
1.0000 | 0 refills | Status: DC | PRN
Start: 2019-08-01 — End: 2019-12-31

## 2019-08-01 NOTE — Patient Instructions (Signed)

## 2019-08-01 NOTE — Progress Notes (Signed)
I connected with  Domingo Pulse on 08/01/19 at  2:30 PM EST by telephone and verified that I am speaking with the correct person using two identifiers.   I discussed the limitations, risks, security and privacy concerns of performing an evaluation and management service by telephone and the availability of in person appointments. I also discussed with the patient that there may be a patient responsible charge related to this service. The patient expressed understanding and agreed to proceed. Explained I am completing her New OB Intake today. We discussed Her EDD and that it is based on  sure LMP . I reviewed her allergies, meds, OB History, Medical /Surgical history, and appropriate screenings. I explained I will send her the Babyscripts app- app sent to her while on phone.  I explained we will send a blood pressure cuff to [facility] that will [facility] that prescription and they  will call her to verify her information. I asked her to bring the blood pressure cuff with her to her first ob appointment so we can show her how to use it. Explained  then we will have her take her blood pressure weekly and enter into the app. Explained she will have some visits in office and some virtually. She already has Community education officer. Reviewed appointment date/ time with her , our location and to wear mask, no visitors. Explained she will have exam, ob bloodwork, hemoglobin a1C, cbg , genetic testing if desired, pap if needed. I scheduled an [place] for first available appointment since she is already 24 weeks and gave her the appointment. I also offered genetic counseling due to her age which she declined. She voices understanding.  Linda,RN 08/01/2019  2:28 PM

## 2019-08-03 ENCOUNTER — Other Ambulatory Visit: Payer: Self-pay | Admitting: Lactation Services

## 2019-08-03 DIAGNOSIS — O0990 Supervision of high risk pregnancy, unspecified, unspecified trimester: Secondary | ICD-10-CM

## 2019-08-08 ENCOUNTER — Ambulatory Visit (INDEPENDENT_AMBULATORY_CARE_PROVIDER_SITE_OTHER): Payer: Medicaid Other | Admitting: Student

## 2019-08-08 ENCOUNTER — Other Ambulatory Visit (HOSPITAL_COMMUNITY)
Admission: RE | Admit: 2019-08-08 | Discharge: 2019-08-08 | Disposition: A | Discharge status: Home / Self Care | Payer: Medicaid Other | Source: Ambulatory Visit | Attending: Student | Admitting: Student

## 2019-08-08 ENCOUNTER — Encounter: Payer: Self-pay | Admitting: Student

## 2019-08-08 ENCOUNTER — Other Ambulatory Visit: Payer: Self-pay

## 2019-08-08 ENCOUNTER — Other Ambulatory Visit: Payer: Medicaid Other

## 2019-08-08 VITALS — BP 95/57 | HR 86

## 2019-08-08 DIAGNOSIS — O0990 Supervision of high risk pregnancy, unspecified, unspecified trimester: Secondary | ICD-10-CM | POA: Insufficient documentation

## 2019-08-08 DIAGNOSIS — O0992 Supervision of high risk pregnancy, unspecified, second trimester: Secondary | ICD-10-CM

## 2019-08-08 DIAGNOSIS — O09529 Supervision of elderly multigravida, unspecified trimester: Secondary | ICD-10-CM | POA: Diagnosis not present

## 2019-08-08 DIAGNOSIS — O09522 Supervision of elderly multigravida, second trimester: Secondary | ICD-10-CM

## 2019-08-08 NOTE — Progress Notes (Signed)
  Subjective:    Jessica Ballard is being seen today for her first obstetrical visit.  This is not a planned pregnancy. She is at [redacted]w[redacted]d gestation. Her obstetrical history is significant for nothing. She denies blood sugar issues in the past, blood pressures issues, history of pre-term delivery. . Relationship with FOB: spouse, living together. Patient does intend to breast feed. Pregnancy history fully reviewed.  Patient reports no complaints.  Review of Systems:   Review of Systems  Constitutional: Negative.   HENT: Negative.   Respiratory: Negative.   Cardiovascular: Negative.   Genitourinary: Negative.   Neurological: Negative.   Hematological: Negative.     Objective:     BP (!) 95/57   Pulse 86   LMP 02/13/2019 (Exact Date)  Physical Exam  Constitutional: She appears well-developed.  HENT:  Head: Normocephalic.  Neck: Normal range of motion.  Respiratory: Effort normal.  GI: Soft.  Genitourinary:    Vagina normal.   Musculoskeletal: Normal range of motion.  Neurological: She is alert.  Skin: Skin is warm.  Psychiatric: She has a normal mood and affect.    Exam    Assessment:    Pregnancy: [id number] Patient Active Problem List   Diagnosis Date Noted  . Supervision of high risk pregnancy, antepartum 08/01/2019  . AMA (advanced maternal age) multigravida 35+ 08/01/2019  . Fatigue 02/25/2017  . Contraception 04/03/2016  . Health care maintenance 04/03/2016  . Underweight 03/17/2013  . IDA (iron deficiency anemia) 03/17/2013  . Loss of weight 03/14/2013  . Lipoma of scalp 01/06/2012  . Non-scarring alopecia 01/06/2012  . Family history of diabetes mellitus type II 01/06/2012       Plan:     Initial labs drawn. Prenatal vitamins. Problem list reviewed and updated. AFP3 discussed: too late. Role of ultrasound in pregnancy discussed; fetal survey: requested. Amniocentesis discussed: not indicated. Follow up in 3 weeks. 50% of 30 min visit spent on  counseling and coordination of care.  -Pap smear today -Patient has [place] scheduled for 11/16 -declines genetic testing -Patient thinks her LMP was [id number]; she is certain "within a few days" that this is the first day of her LMP.  -Baby RX  Is on patient's phone. -She has blood pressure cuff at home and knows how to use, is comfortable taking her BP. Reviewed warning signs of pre-e and elevated BPs.  -patient can have in person at 28 weeks and then move to the regular LROB schedule after that.   Maye Hides            Mervyn Skeeters Holdyn Poyser 08/08/2019

## 2019-08-08 NOTE — Patient Instructions (Signed)

## 2019-08-08 NOTE — Progress Notes (Signed)
-  has BP -has BabyRX -

## 2019-08-09 LAB — OBSTETRIC PANEL, INCLUDING HIV
Antibody Screen: NEGATIVE
Basophils Absolute: 0.1 10*3/uL (ref 0.0–0.2)
Basos: 0 %
EOS (ABSOLUTE): 0.3 10*3/uL (ref 0.0–0.4)
Eos: 3 %
HIV Screen 4th Generation wRfx: NONREACTIVE
Hematocrit: 28.9 % — ABNORMAL LOW (ref 34.0–46.6)
Hemoglobin: 9.1 g/dL — ABNORMAL LOW (ref 11.1–15.9)
Hepatitis B Surface Ag: NEGATIVE
Immature Grans (Abs): 0.1 10*3/uL (ref 0.0–0.1)
Immature Granulocytes: 1 %
Lymphocytes Absolute: 1.9 10*3/uL (ref 0.7–3.1)
Lymphs: 17 %
MCH: 23.8 pg — ABNORMAL LOW (ref 26.6–33.0)
MCHC: 31.5 g/dL (ref 31.5–35.7)
MCV: 76 fL — ABNORMAL LOW (ref 79–97)
Monocytes Absolute: 0.5 10*3/uL (ref 0.1–0.9)
Monocytes: 5 %
Neutrophils Absolute: 8.4 10*3/uL — ABNORMAL HIGH (ref 1.4–7.0)
Neutrophils: 74 %
Platelets: 335 10*3/uL (ref 150–450)
RBC: 3.82 x10E6/uL (ref 3.77–5.28)
RDW: 15.2 % (ref 11.7–15.4)
RPR Ser Ql: NONREACTIVE
Rh Factor: POSITIVE
Rubella Antibodies, IGG: 13.6 index (ref >0.99)
WBC: 11.3 10*3/uL — ABNORMAL HIGH (ref 3.4–10.8)

## 2019-08-09 LAB — GLUCOSE TOLERANCE, 2 HOURS W/ 1HR
Glucose, 1 hour: 135 mg/dL (ref 65–179)
Glucose, 2 hour: 123 mg/dL (ref 65–152)
Glucose, Fasting: 92 mg/dL — ABNORMAL HIGH (ref 65–91)

## 2019-08-09 LAB — HEMOGLOBIN A1C
Est. average glucose Bld gHb Est-mCnc: 108 mg/dL
Hgb A1c MFr Bld: 5.4 % (ref 4.8–5.6)

## 2019-08-09 NOTE — Progress Notes (Signed)
Chart reviewed for nurse visit. Agree with plan of care.   Jessica Ballard, [place] 08/09/2019 3:22 PM

## 2019-08-10 LAB — CULTURE, OB URINE

## 2019-08-10 LAB — CYTOLOGY - PAP
Chlamydia: NEGATIVE
Comment: NEGATIVE
Comment: NEGATIVE
Comment: NORMAL
Diagnosis: NEGATIVE
High risk HPV: NEGATIVE
Neisseria Gonorrhea: NEGATIVE

## 2019-08-10 LAB — URINE CULTURE, OB REFLEX: Organism ID, Bacteria: NO GROWTH

## 2019-08-11 ENCOUNTER — Telehealth: Payer: Self-pay | Admitting: *Deleted

## 2019-08-11 DIAGNOSIS — O0990 Supervision of high risk pregnancy, unspecified, unspecified trimester: Secondary | ICD-10-CM

## 2019-08-11 DIAGNOSIS — D508 Other iron deficiency anemias: Secondary | ICD-10-CM

## 2019-08-11 DIAGNOSIS — O24419 Gestational diabetes mellitus in pregnancy, unspecified control: Secondary | ICD-10-CM

## 2019-08-11 DIAGNOSIS — O09529 Supervision of elderly multigravida, unspecified trimester: Secondary | ICD-10-CM

## 2019-08-11 MED ORDER — IRON (FERROUS SULFATE) 325 (65 FE) MG PO TABS
1.0000 | ORAL_TABLET | Freq: Three times a day (TID) | ORAL | 0 refills | Status: DC
Start: 2019-08-11 — End: 2019-09-21

## 2019-08-11 NOTE — Telephone Encounter (Addendum)
Ludmilla called and left a message she is having symptoms of lightheadedness and like going to pass out and almost like a little difficult to breathe. States she is at work and she took a break and will see how she is.  I called Maie and she states her husband brought her home and she ate a snack of juice and banana and feels a little better. She states she did eat breakfast of peanut butter sandwich and boiled egg. 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 We discussed it is common to get a little lightheaded in pregnancy and she should get up slowly if sitting / lying down. She has a history of feeling lightheaded in past, states hasn't for a long time.  Per review of chart she if anemic with hgb of 9.1 . We discussed this could contribute to her feeling lightheaded.  I informed her we will initate our anemia protocol and will send RX for Iron she should take TID with orange juice / food to help increase absorption and decrease gi issues. She states she may not be able to take tid but she will try.We also discussed can cause constipation- let us know if  It does and we will give her advise. I also informed her we will order a nutrician consult with dietician and she will be called with appointment. She also asked about her other results from 08/08/19 which  I reviewed. We also disucssed she did not pass her 2  Hr screen and we will initiate GDM education and will be with same appt at diet education for anemia. I advised her to be sure she is eating well, drinking fluids well. I also informed her I will put a pregnancy restrictions letter in her MyChart if needed. Also advised if feeling lightheaded , worsens go to the hosital for evaluation. She voices understanding. Linda,RN

## 2019-08-14 ENCOUNTER — Ambulatory Visit (HOSPITAL_COMMUNITY): Payer: Medicaid Other | Admitting: *Deleted

## 2019-08-14 ENCOUNTER — Other Ambulatory Visit: Payer: Self-pay

## 2019-08-14 ENCOUNTER — Encounter (HOSPITAL_COMMUNITY): Payer: Self-pay | Admitting: *Deleted

## 2019-08-14 ENCOUNTER — Ambulatory Visit (HOSPITAL_COMMUNITY)
Admission: RE | Admit: 2019-08-14 | Discharge: 2019-08-14 | Disposition: A | Discharge status: Home / Self Care | Payer: Medicaid Other | Source: Ambulatory Visit | Attending: Student | Admitting: Student

## 2019-08-14 DIAGNOSIS — O09529 Supervision of elderly multigravida, unspecified trimester: Secondary | ICD-10-CM

## 2019-08-14 DIAGNOSIS — O09522 Supervision of elderly multigravida, second trimester: Secondary | ICD-10-CM

## 2019-08-14 DIAGNOSIS — O0990 Supervision of high risk pregnancy, unspecified, unspecified trimester: Secondary | ICD-10-CM

## 2019-08-14 DIAGNOSIS — O4402 Placenta previa specified as without hemorrhage, second trimester: Secondary | ICD-10-CM

## 2019-08-14 DIAGNOSIS — O24419 Gestational diabetes mellitus in pregnancy, unspecified control: Secondary | ICD-10-CM | POA: Diagnosis not present

## 2019-08-14 DIAGNOSIS — Z3A20 20 weeks gestation of pregnancy: Secondary | ICD-10-CM

## 2019-08-15 ENCOUNTER — Other Ambulatory Visit: Payer: Self-pay | Admitting: Student

## 2019-08-15 MED ORDER — ASPIRIN EC 81 MG PO TBEC: 81.0000 mg | DELAYED_RELEASE_TABLET | Freq: Every day | ORAL | 3 refills | Status: DC

## 2019-08-18 ENCOUNTER — Telehealth: Payer: Self-pay

## 2019-08-18 NOTE — Telephone Encounter (Signed)
Pt left VM on nurse line requesting a call back to discuss new medication.  Called pt. Pt states she was not told the reason for aspirin 81 mg rx. Per chart review, aspirin ordered by Ardean Larsen, CNM related to new diagnosis of GDM. Educated pt about use of aspirin during pregnancy. Pt verbalizes understanding and agrees to begin taking medication as prescribed.  Pt also states she is unsure if she needs to check BG before diabetes education appt on 09/06/19. Notified pt I will clarify with Kooistra, CNM and will notify her if there is a need to check BG prior to appt.

## 2019-08-21 ENCOUNTER — Other Ambulatory Visit: Payer: Self-pay | Admitting: *Deleted

## 2019-08-21 DIAGNOSIS — O24419 Gestational diabetes mellitus in pregnancy, unspecified control: Secondary | ICD-10-CM

## 2019-08-21 MED ORDER — ACCU-CHEK FASTCLIX LANCETS MISC: 1.0000 | Freq: Four times a day (QID) | 12 refills | Status: DC

## 2019-08-21 MED ORDER — ACCU-CHEK GUIDE VI STRP: ORAL_STRIP | 12 refills | Status: DC

## 2019-08-21 MED ORDER — ACCU-CHEK GUIDE ME W/DEVICE KIT: 1.0000 | PACK | Freq: Once | 0 refills | Status: AC

## 2019-08-28 ENCOUNTER — Encounter: Payer: Self-pay | Admitting: Advanced Practice Midwife

## 2019-08-28 DIAGNOSIS — O4400 Placenta previa specified as without hemorrhage, unspecified trimester: Secondary | ICD-10-CM | POA: Insufficient documentation

## 2019-08-29 ENCOUNTER — Other Ambulatory Visit: Payer: Self-pay

## 2019-08-29 ENCOUNTER — Ambulatory Visit (INDEPENDENT_AMBULATORY_CARE_PROVIDER_SITE_OTHER): Payer: Medicaid Other | Admitting: Advanced Practice Midwife

## 2019-08-29 VITALS — BP 99/65 | HR 68 | Wt 128.1 lb

## 2019-08-29 DIAGNOSIS — O0992 Supervision of high risk pregnancy, unspecified, second trimester: Secondary | ICD-10-CM

## 2019-08-29 DIAGNOSIS — O0990 Supervision of high risk pregnancy, unspecified, unspecified trimester: Secondary | ICD-10-CM

## 2019-08-29 DIAGNOSIS — O24419 Gestational diabetes mellitus in pregnancy, unspecified control: Secondary | ICD-10-CM

## 2019-08-29 DIAGNOSIS — Z3A22 22 weeks gestation of pregnancy: Secondary | ICD-10-CM

## 2019-08-29 DIAGNOSIS — O09522 Supervision of elderly multigravida, second trimester: Secondary | ICD-10-CM

## 2019-08-29 NOTE — Patient Instructions (Signed)

## 2019-08-29 NOTE — Progress Notes (Signed)
   PRENATAL VISIT NOTE  Subjective:  Jessica Ballard is a 38 y.o. G4P3003 at [redacted]w[redacted]d being seen today for ongoing prenatal care.  She is currently monitored for the following issues for this low-risk pregnancy and has Lipoma of scalp; Non-scarring alopecia; Family history of diabetes mellitus type II; Loss of weight; Underweight; IDA (iron deficiency anemia); Contraception; Health care maintenance; Fatigue; Supervision of high risk pregnancy, antepartum; AMA (advanced maternal age) multigravida 35+; Gestational diabetes mellitus (GDM) affecting pregnancy; and Placenta previa on their problem list.  Patient reports no complaints.  Contractions: Not present. Vag. Bleeding: None.  Movement: Present. Denies leaking of fluid.   Patient was previously diagnosed with A1GDM based on elevated fasting glucose on 08/08/19. She reports drinking a glass of chocolate milk and believes this is the reason for her elevated reading. She is agreeable to monitoring her glucose at home but is having difficulty obtaining a glucometer. She said her insurance is telling her that she has three different insurance providers and she is calling them today to rectify the administrative error.  The following portions of the patient's history were reviewed and updated as appropriate: allergies, current medications, past family history, past medical history, past social history, past surgical history and problem list. Problem list updated.  Objective:   Vitals:   08/29/19 0824  BP: 99/65  Pulse: 68  Weight: 128 lb 1.6 oz (58.1 kg)    Fetal Status: Fetal Heart Rate (bpm): 139   Movement: Present     General:  Alert, oriented and cooperative. Patient is in no acute distress.  Skin: Skin is warm and dry. No rash noted.   Cardiovascular: Normal heart rate noted  Respiratory: Normal respiratory effort, no problems with respiration noted  Abdomen: Soft, gravid, appropriate for gestational age.  Pain/Pressure: Absent     Pelvic:  Cervical exam deferred        Extremities: Normal range of motion.  Edema: None  Mental Status: Normal mood and affect. Normal behavior. Normal judgment and thought content.   Assessment and Plan:  Pregnancy: G4P3003 at [redacted]w[redacted]d  1. Supervision of high risk pregnancy, antepartum - Continue routine care - Taking Fe as previously advised, not taking daily ASA 81mg .  - Reviewed indications for ASA 81mg , encouraged to reconsider - Repeat scan with MFM due mid-December, patient needs to schedule  2. Gestational diabetes mellitus (GDM) affecting pregnancy - Patient to notify clinic if she has trouble obtaining glucometer - Reassured she will have repeat GTT at next visit   3. Multigravida of advanced maternal age in second trimester - Age 42 on due date  Preterm labor symptoms and general obstetric precautions including but not limited to vaginal bleeding, contractions, leaking of fluid and fetal movement were reviewed in detail with the patient. Please refer to After Visit Summary for other counseling recommendations.  Return in about 6 weeks (around 10/10/2019) for In person for 28 week labs.  Future Appointments  Date Time Provider [place]  09/06/2019  2:00 PM [place]  10/10/2019  8:15 AM Ephraim Hamburger, Rona Ravens, NP [facility]  10/10/2019  8:50 AM [facility] [place], CNM

## 2019-09-06 ENCOUNTER — Encounter: Payer: Medicaid Other | Attending: Family Medicine | Admitting: *Deleted

## 2019-09-06 ENCOUNTER — Ambulatory Visit: Payer: Medicaid Other | Admitting: *Deleted

## 2019-09-06 ENCOUNTER — Other Ambulatory Visit: Payer: Self-pay

## 2019-09-06 DIAGNOSIS — O24419 Gestational diabetes mellitus in pregnancy, unspecified control: Secondary | ICD-10-CM | POA: Diagnosis not present

## 2019-09-06 DIAGNOSIS — Z713 Dietary counseling and surveillance: Secondary | ICD-10-CM | POA: Insufficient documentation

## 2019-09-06 DIAGNOSIS — O09529 Supervision of elderly multigravida, unspecified trimester: Secondary | ICD-10-CM | POA: Diagnosis not present

## 2019-09-06 DIAGNOSIS — Z3A00 Weeks of gestation of pregnancy not specified: Secondary | ICD-10-CM | POA: Insufficient documentation

## 2019-09-06 MED ORDER — ACCU-CHEK FASTCLIX LANCET KIT: 1.0000 | PACK | Freq: Once | 0 refills | Status: AC

## 2019-09-06 NOTE — Progress Notes (Signed)
  Patient was seen on 09/06/2019 for Gestational Diabetes self-management. EDD 01/02/2020. Patient states no history of GDM. Diet history obtained. Patient eats very good variety of all food groups. 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  The following learning objectives were met by the patient :   States the definition of Gestational Diabetes  States why dietary management is important in controlling blood glucose  Describes the effects of carbohydrates on blood glucose levels  Demonstrates ability to create a balanced meal plan  Demonstrates carbohydrate counting   States when to check blood glucose levels  Demonstrates proper blood glucose monitoring techniques  States the effect of stress and exercise on blood glucose levels  States the importance of limiting caffeine and abstaining from alcohol and smoking  Plan:  Aim for 3 Carb Choices per meal (45 grams) +/- 1 either way  Aim for 1-2 Carbs per snack Begin reading food labels for Total Carbohydrate of foods If OK with your MD, consider  increasing your activity level by walking, Arm Chair Exercises or other activity daily as tolerated Begin checking BG before breakfast and 2 hours after first bite of breakfast, lunch and dinner as directed by MD  Bring Log Book/Sheet and meter to every medical appointment OR use Baby Scripts (see below) Baby Scripts:  Patient was introduced to [employer] and plans to use as record of BG electronically  Take medication if directed by MD  Patient already has a meter: Accu Chek Guide And is testing pre breakfast and 2 hours each meal as directed by MD Review of Log Book shows: fasting blood sugars have all been under 95 mg/dl Patient instructed to test pre breakfast and 2 hours each meal as directed by MD  Patient instructed to monitor glucose levels: FBS: 60  - 95 mg/dl 2 hour: <120 mg/dl  Patient received the following handouts:  Nutrition Diabetes and Pregnancy  Carbohydrate Counting List  Patient will be seen for follow-up as needed.

## 2019-09-15 ENCOUNTER — Other Ambulatory Visit: Payer: Self-pay

## 2019-09-15 ENCOUNTER — Encounter: Payer: Self-pay | Admitting: Family Medicine

## 2019-09-16 ENCOUNTER — Other Ambulatory Visit: Payer: Self-pay | Admitting: Student

## 2019-09-16 DIAGNOSIS — O0990 Supervision of high risk pregnancy, unspecified, unspecified trimester: Secondary | ICD-10-CM

## 2019-09-16 DIAGNOSIS — D508 Other iron deficiency anemias: Secondary | ICD-10-CM

## 2019-09-29 NOTE — L&D Delivery Note (Signed)
Patient is a 39 y.o. now G4P4 s/p NSVD at [redacted]w[redacted]d, who was admitted for GDM A1, FGR and AMA.  She progressed with augmentation to complete (FB and Pitocin) and pushed <2 minutes to deliver.  Cord clamping delayed by several minutes then clamped by CNM and cut by Resident.  Placenta intact and spontaneous, bleeding minimal.  1st degree and left labial laceration repaired without difficulty.  Mom and baby stable prior to transfer to postpartum. She plans on breastfeeding. She requests OCPs for birth control.  Delivery Note At 12:32 AM a viable and healthy female was delivered via Vaginal, Spontaneous (Presentation: Right Occiput Anterior).  APGAR: 9,9 ; weight pending.  Placenta status: Spontaneous, Intact.  Cord: 3 vessels with the following complications: None.    Anesthesia: Local Episiotomy:  None Lacerations:  1st degree perineal and left labial Suture Repair: 3.0 vicryl 4.0 vicryl Est. Blood Loss (mL):  175  Mom to postpartum.  Baby to Couplet care / Skin to Skin.  Lajean Manes CNM 12/30/2019, 1:07 AM

## 2019-10-09 ENCOUNTER — Other Ambulatory Visit: Payer: Self-pay | Admitting: *Deleted

## 2019-10-09 DIAGNOSIS — O0990 Supervision of high risk pregnancy, unspecified, unspecified trimester: Secondary | ICD-10-CM

## 2019-10-10 ENCOUNTER — Ambulatory Visit (INDEPENDENT_AMBULATORY_CARE_PROVIDER_SITE_OTHER): Payer: Medicaid Other | Admitting: Nurse Practitioner

## 2019-10-10 ENCOUNTER — Encounter: Payer: Self-pay | Admitting: Nurse Practitioner

## 2019-10-10 ENCOUNTER — Other Ambulatory Visit: Payer: Medicaid Other

## 2019-10-10 ENCOUNTER — Other Ambulatory Visit: Payer: Self-pay

## 2019-10-10 VITALS — BP 101/68 | HR 73 | Wt 129.0 lb

## 2019-10-10 DIAGNOSIS — O09523 Supervision of elderly multigravida, third trimester: Secondary | ICD-10-CM

## 2019-10-10 DIAGNOSIS — O09529 Supervision of elderly multigravida, unspecified trimester: Secondary | ICD-10-CM

## 2019-10-10 DIAGNOSIS — O0990 Supervision of high risk pregnancy, unspecified, unspecified trimester: Secondary | ICD-10-CM | POA: Diagnosis not present

## 2019-10-10 DIAGNOSIS — O24419 Gestational diabetes mellitus in pregnancy, unspecified control: Secondary | ICD-10-CM

## 2019-10-10 DIAGNOSIS — O4403 Placenta previa specified as without hemorrhage, third trimester: Secondary | ICD-10-CM

## 2019-10-10 DIAGNOSIS — Z3A28 28 weeks gestation of pregnancy: Secondary | ICD-10-CM

## 2019-10-10 DIAGNOSIS — O0993 Supervision of high risk pregnancy, unspecified, third trimester: Secondary | ICD-10-CM

## 2019-10-10 NOTE — Progress Notes (Signed)
    Subjective:  Jessica Ballard is a 39 y.o. G4P3003 at [redacted]w[redacted]d being seen today for ongoing prenatal care.  She is currently monitored for the following issues for this high-risk pregnancy and has Lipoma of scalp; Non-scarring alopecia; Family history of diabetes mellitus type II; Loss of weight; Underweight; IDA (iron deficiency anemia); Contraception; Health care maintenance; Fatigue; Supervision of high risk pregnancy, antepartum; AMA (advanced maternal age) multigravida 35+; Gestational diabetes mellitus (GDM) affecting pregnancy; and Placenta previa on their problem list.  Patient reports no complaints.  Contractions: Not present. Vag. Bleeding: None.  Movement: Present. Denies leaking of fluid.   Client has gone to blood sugar education and is checking her sugars.  Putting into babyscripts,, but still has questions about whether she has gestational diabetes.  Her mother had diabetes - years ago.  Client had one slightly elevated reading on her 2 hr glucola two months ago.  Due to her hesitation and wondering, will repeat 2 hr glucola today.  Is checking her BP and is recording it into babyscripts - readings reviewed today.  The following portions of the patient's history were reviewed and updated as appropriate: allergies, current medications, past family history, past medical history, past social history, past surgical history and problem list. Problem list updated.  Objective:   Vitals:   10/10/19 0849  BP: 101/68  Pulse: 73  Weight: 129 lb (58.5 kg)    Fetal Status: Fetal Heart Rate (bpm): 150 Fundal Height: 28 cm Movement: Present     General:  Alert, oriented and cooperative. Patient is in no acute distress.  Skin: Skin is warm and dry. No rash noted.   Cardiovascular: Normal heart rate noted  Respiratory: Normal respiratory effort, no problems with respiration noted  Abdomen: Soft, gravid, appropriate for gestational age. Pain/Pressure: Absent     Pelvic:  Cervical exam deferred         Extremities: Normal range of motion.  Edema: None  Mental Status: Normal mood and affect. Normal behavior. Normal judgment and thought content.   Urinalysis:      Assessment and Plan:  Pregnancy: G4P3003 at [redacted]w[redacted]d  1. Supervision of high risk pregnancy, antepartum Doing well.  Baby is moving well. Wants TDAP at next visit.  Info sheet given to her.            - [place] MFM OB FOLLOW UP; Future  2. Gestational diabetes mellitus (GDM) affecting pregnancy See note above   3. Antepartum multigravida of advanced maternal age  35. Placenta previa in third trimester Has not yet had a follow up US - will schedule. Has had intercourse weekly - did not know she was not to have intercourse.  - [place] MFM OB FOLLOW UP; Future  Preterm labor symptoms and general obstetric precautions including but not limited to vaginal bleeding, contractions, leaking of fluid and fetal movement were reviewed in detail with the patient. Please refer to After Visit Summary for other counseling recommendations.  Return in about 4 weeks (around 11/07/2019) for In person ROB.  Earlie Server, RN, MSN, NP-BC Nurse Practitioner, [facility] for [employer], [place] [facility] 10/10/2019 7:24 PM

## 2019-10-11 LAB — CBC
Hematocrit: 32 % — ABNORMAL LOW (ref 34.0–46.6)
Hemoglobin: 10 g/dL — ABNORMAL LOW (ref 11.1–15.9)
MCH: 24.8 pg — ABNORMAL LOW (ref 26.6–33.0)
MCHC: 31.3 g/dL — ABNORMAL LOW (ref 31.5–35.7)
MCV: 79 fL (ref 79–97)
Platelets: 339 10*3/uL (ref 150–450)
RBC: 4.04 x10E6/uL (ref 3.77–5.28)
RDW: 16.7 % — ABNORMAL HIGH (ref 11.7–15.4)
WBC: 9.8 10*3/uL (ref 3.4–10.8)

## 2019-10-11 LAB — RPR: RPR Ser Ql: NONREACTIVE

## 2019-10-11 LAB — GLUCOSE TOLERANCE, 2 HOURS W/ 1HR
Glucose, 1 hour: 155 mg/dL (ref 65–179)
Glucose, 2 hour: 78 mg/dL (ref 65–152)
Glucose, Fasting: 82 mg/dL (ref 65–91)

## 2019-10-11 LAB — HIV ANTIBODY (ROUTINE TESTING W REFLEX): HIV Screen 4th Generation wRfx: NONREACTIVE

## 2019-10-18 ENCOUNTER — Other Ambulatory Visit: Payer: Self-pay | Admitting: Student

## 2019-10-18 DIAGNOSIS — O24419 Gestational diabetes mellitus in pregnancy, unspecified control: Secondary | ICD-10-CM

## 2019-10-18 MED ORDER — GOODSENSE PRENATAL VITAMINS 28-0.8 MG PO TABS: 1.0000 | ORAL_TABLET | Freq: Every day | ORAL | 3 refills | Status: DC

## 2019-10-18 NOTE — Progress Notes (Signed)
Patient failed her two hour, but she passed her second 2 hour GTT. Discussed with MD, who recommends that patient be treated as GDMA1 to be safe. Patient agrees to continue checking her blood sugar and reporting them Jessica Ballard

## 2019-10-20 ENCOUNTER — Encounter: Payer: Self-pay | Admitting: Obstetrics and Gynecology

## 2019-10-20 NOTE — Progress Notes (Signed)
Reviewed data from Babyscripts, patient has not been logging CBGs 4x per day. Per note, patient seen two days ago for 2nd GTT and will be treated as gDMA1. Agreed to start logging CBGs at that visit.   Feliz Beam, M.D. Attending Center for [employer] farm manager)

## 2019-10-27 ENCOUNTER — Ambulatory Visit (HOSPITAL_COMMUNITY): Payer: Medicaid Other | Admitting: *Deleted

## 2019-10-27 ENCOUNTER — Encounter (HOSPITAL_COMMUNITY): Payer: Self-pay

## 2019-10-27 ENCOUNTER — Other Ambulatory Visit: Payer: Self-pay

## 2019-10-27 ENCOUNTER — Ambulatory Visit (HOSPITAL_COMMUNITY)
Admission: RE | Admit: 2019-10-27 | Discharge: 2019-10-27 | Disposition: A | Discharge status: Home / Self Care | Payer: Medicaid Other | Source: Ambulatory Visit | Attending: Obstetrics and Gynecology | Admitting: Obstetrics and Gynecology

## 2019-10-27 DIAGNOSIS — O09529 Supervision of elderly multigravida, unspecified trimester: Secondary | ICD-10-CM

## 2019-10-27 DIAGNOSIS — O4403 Placenta previa specified as without hemorrhage, third trimester: Secondary | ICD-10-CM | POA: Diagnosis not present

## 2019-10-27 DIAGNOSIS — Z3A30 30 weeks gestation of pregnancy: Secondary | ICD-10-CM

## 2019-10-27 DIAGNOSIS — Z362 Encounter for other antenatal screening follow-up: Secondary | ICD-10-CM | POA: Diagnosis not present

## 2019-10-27 DIAGNOSIS — O0990 Supervision of high risk pregnancy, unspecified, unspecified trimester: Secondary | ICD-10-CM

## 2019-10-27 DIAGNOSIS — O24419 Gestational diabetes mellitus in pregnancy, unspecified control: Secondary | ICD-10-CM

## 2019-10-27 DIAGNOSIS — O0993 Supervision of high risk pregnancy, unspecified, third trimester: Secondary | ICD-10-CM | POA: Diagnosis not present

## 2019-11-06 ENCOUNTER — Other Ambulatory Visit (HOSPITAL_COMMUNITY): Payer: Self-pay | Admitting: *Deleted

## 2019-11-06 DIAGNOSIS — O4400 Placenta previa specified as without hemorrhage, unspecified trimester: Secondary | ICD-10-CM

## 2019-11-07 ENCOUNTER — Ambulatory Visit (INDEPENDENT_AMBULATORY_CARE_PROVIDER_SITE_OTHER): Payer: Medicaid Other | Admitting: Advanced Practice Midwife

## 2019-11-07 ENCOUNTER — Other Ambulatory Visit: Payer: Self-pay

## 2019-11-07 ENCOUNTER — Encounter: Payer: Self-pay | Admitting: Advanced Practice Midwife

## 2019-11-07 VITALS — BP 106/62 | HR 87 | Wt 131.5 lb

## 2019-11-07 DIAGNOSIS — Z3A32 32 weeks gestation of pregnancy: Secondary | ICD-10-CM

## 2019-11-07 DIAGNOSIS — Z880 Allergy status to penicillin: Secondary | ICD-10-CM

## 2019-11-07 DIAGNOSIS — O0990 Supervision of high risk pregnancy, unspecified, unspecified trimester: Secondary | ICD-10-CM

## 2019-11-07 DIAGNOSIS — O4403 Placenta previa specified as without hemorrhage, third trimester: Secondary | ICD-10-CM

## 2019-11-07 DIAGNOSIS — O24419 Gestational diabetes mellitus in pregnancy, unspecified control: Secondary | ICD-10-CM

## 2019-11-07 DIAGNOSIS — O0993 Supervision of high risk pregnancy, unspecified, third trimester: Secondary | ICD-10-CM

## 2019-11-07 NOTE — Patient Instructions (Signed)

## 2019-11-07 NOTE — Progress Notes (Signed)
   PRENATAL VISIT NOTE  Subjective:  Jessica Ballard is a 39 y.o. G4P3003 at [redacted]w[redacted]d being seen today for ongoing prenatal care.  She is currently monitored for the following issues for this high-risk pregnancy and has Lipoma of scalp; Non-scarring alopecia; Family history of diabetes mellitus type II; IDA (iron deficiency anemia); Health care maintenance; Fatigue; Supervision of high risk pregnancy, antepartum; AMA (advanced maternal age) multigravida 35+; Gestational diabetes mellitus (GDM) affecting pregnancy; and Placenta previa on their problem list.  Patient reports no complaints.  Contractions: Not present. Vag. Bleeding: None.  Movement: Present. Denies leaking of fluid.   The following portions of the patient's history were reviewed and updated as appropriate: allergies, current medications, past family history, past medical history, past social history, past surgical history and problem list. Problem list updated.  Objective:   Vitals:   11/07/19 0833  BP: 106/62  Pulse: 87  Weight: 131 lb 8 oz (59.6 kg)    Fetal Status: Fetal Heart Rate (bpm): 132 Fundal Height: 30 cm Movement: Present     General:  Alert, oriented and cooperative. Patient is in no acute distress.  Skin: Skin is warm and dry. No rash noted.   Cardiovascular: Normal heart rate noted  Respiratory: Normal respiratory effort, no problems with respiration noted  Abdomen: Soft, gravid, appropriate for gestational age.  Pain/Pressure: Present     Pelvic: Cervical exam deferred        Extremities: Normal range of motion.  Edema: None  Mental Status: Normal mood and affect. Normal behavior. Normal judgment and thought content.   Assessment and Plan:  Pregnancy: G4P3003 at [redacted]w[redacted]d  1. Supervision of high risk pregnancy, antepartum - TDAP declined  2. Gestational diabetes mellitus (GDM) affecting pregnancy - Abnormal initial, normal repeat. Patient previously agreeable to 4 readings per day. Consistency is a challenge  - See readings in separate clinic note - Encouraged to find time for readings per day. Discussed with patient that this helps [place] prove that she is well-controlled and does not need additional surveillance/interventions  3. Placenta previa in third trimester - Next appointment with MFM 12/01/19  4. Penicillin allergy - Itching and hives during treatment for GBS in labor - Needs sensitivities with swabs  Preterm labor symptoms and general obstetric precautions including but not limited to vaginal bleeding, contractions, leaking of fluid and fetal movement were reviewed in detail with the patient. Please refer to After Visit Summary for other counseling recommendations.  Return in about 2 weeks (around 11/21/2019) for Virtual to review blood sugars, FEMALE PROVIDER.  Future Appointments  Date Time Provider [place]  12/01/2019  1:30 PM [place] [facility]  12/01/2019  1:30 PM [facility] [place] 1 [facility]    Darlina Rumpf, CNM

## 2019-11-07 NOTE — Progress Notes (Signed)
BRx Glucose readings:       

## 2019-11-17 ENCOUNTER — Telehealth: Payer: Self-pay | Admitting: *Deleted

## 2019-11-17 NOTE — Telephone Encounter (Signed)
Babyscripts sent message stating that pt triggered an elevated blood sugar this morning. I called pt and discussed this information. She stated that her fasting blood sugar today was 88 however when she was entering the value to babyRx she accidentally entered 0. Other blood sugar values were reviewed w/pt and they were all wnl. Pt has next appt scheduled on 2/23.

## 2019-11-21 ENCOUNTER — Telehealth (INDEPENDENT_AMBULATORY_CARE_PROVIDER_SITE_OTHER): Payer: Medicaid Other | Admitting: Student

## 2019-11-21 DIAGNOSIS — Z3A34 34 weeks gestation of pregnancy: Secondary | ICD-10-CM | POA: Diagnosis not present

## 2019-11-21 DIAGNOSIS — O0990 Supervision of high risk pregnancy, unspecified, unspecified trimester: Secondary | ICD-10-CM

## 2019-11-21 DIAGNOSIS — O09523 Supervision of elderly multigravida, third trimester: Secondary | ICD-10-CM

## 2019-11-21 DIAGNOSIS — O24419 Gestational diabetes mellitus in pregnancy, unspecified control: Secondary | ICD-10-CM | POA: Diagnosis not present

## 2019-11-21 NOTE — Progress Notes (Signed)
BRx Glucose readings:       

## 2019-11-21 NOTE — Progress Notes (Signed)
Patient ID: Jessica Ballard, female   DOB: 06-10-1981, 39 y.o.   MRN: [id number] I connected with@ on 11/21/19 at  8:35 AM EST by: My Chart  and verified that I am speaking with the correct person using two identifiers.  Patient is located at [facility] and provider is located at My Chart.     The purpose of this virtual visit is to provide medical care while limiting exposure to the novel coronavirus. I discussed the limitations, risks, security and privacy concerns of performing an evaluation and management service by My Chart and the availability of in person appointments. I also discussed with the patient that there may be a patient responsible charge related to this service. By engaging in this virtual visit, you consent to the provision of healthcare.  Additionally, you authorize for your insurance to be billed for the services provided during this visit.  The patient expressed understanding and agreed to proceed.  The following staff members participated in the virtual visit:  Carver Fila    PRENATAL VISIT NOTE  Subjective:  Jessica Ballard is a 39 y.o. G4P3003 at [redacted]w[redacted]d  for phone visit for ongoing prenatal care.  She is currently monitored for the following issues for this high-risk pregnancy and has Lipoma of scalp; Non-scarring alopecia; Family history of diabetes mellitus type II; IDA (iron deficiency anemia); Health care maintenance; Fatigue; Supervision of high risk pregnancy, antepartum; AMA (advanced maternal age) multigravida 35+; Gestational diabetes mellitus (GDM) affecting pregnancy; and Placenta previa on their problem list.  Patient reports no complaints. She is finding it very hard to check her blood sugars four times a day. She denies any bleeding from her previa. She knows about her [place] visit next week to check growth and previa.  Contractions: Not present. Vag. Bleeding: None.  Movement: Present. Denies leaking of fluid.   The following portions of the patient's history were reviewed and  updated as appropriate: allergies, current medications, past family history, past medical history, past social history, past surgical history and problem list.   Objective:   Vitals:   11/21/19 0827  BP: 116/68  Pulse: 83   Self-Obtained  Fetal Status:     Movement: Present     Assessment and Plan:  Pregnancy: G4P3003 at [redacted]w[redacted]d 1. Gestational diabetes mellitus (GDM) affecting pregnancy -Reviewed BS in [place]; all normal. Patient is doing great! Positive encouragement given.   2. Supervision of high risk pregnancy, antepartum No bleeding from previa; will reassess previa at [place] appt on 3-5  3. Multigravida of advanced maternal age in third trimester   Preterm labor symptoms and general obstetric precautions including but not limited to vaginal bleeding, contractions, leaking of fluid and fetal movement were reviewed in detail with the patient.  No follow-ups on file.  Future Appointments  Date Time Provider [place]  12/01/2019  1:30 PM [place] [facility]  12/01/2019  1:30 PM [facility]MFC [place] 1 [facility]-MFCUS [facility]     Time spent on virtual visit: 12 minutes  [place], CNM

## 2019-11-21 NOTE — Progress Notes (Signed)
I connected with  Jessica Ballard on 11/21/19 at  8:35 AM EST by telephone and verified that I am speaking with the correct person using two identifiers.   I discussed the limitations, risks, security and privacy concerns of performing an evaluation and management service by telephone and the availability of in person appointments. I also discussed with the patient that there may be a patient responsible charge related to this service. The patient expressed understanding and agreed to proceed.  Bethanne Ginger, CMA 11/21/2019  8:25 AM

## 2019-12-01 ENCOUNTER — Ambulatory Visit (HOSPITAL_COMMUNITY)
Admission: RE | Admit: 2019-12-01 | Discharge: 2019-12-01 | Disposition: A | Discharge status: Home / Self Care | Payer: Medicaid Other | Source: Ambulatory Visit | Attending: Obstetrics and Gynecology | Admitting: Obstetrics and Gynecology

## 2019-12-01 ENCOUNTER — Encounter (HOSPITAL_COMMUNITY): Payer: Self-pay

## 2019-12-01 ENCOUNTER — Other Ambulatory Visit (HOSPITAL_COMMUNITY): Payer: Self-pay | Admitting: *Deleted

## 2019-12-01 ENCOUNTER — Other Ambulatory Visit (HOSPITAL_COMMUNITY): Payer: Self-pay | Admitting: Obstetrics

## 2019-12-01 ENCOUNTER — Other Ambulatory Visit: Payer: Self-pay

## 2019-12-01 ENCOUNTER — Ambulatory Visit (HOSPITAL_COMMUNITY): Payer: Medicaid Other | Admitting: *Deleted

## 2019-12-01 DIAGNOSIS — Z3A35 35 weeks gestation of pregnancy: Secondary | ICD-10-CM

## 2019-12-01 DIAGNOSIS — O4400 Placenta previa specified as without hemorrhage, unspecified trimester: Secondary | ICD-10-CM | POA: Diagnosis not present

## 2019-12-01 DIAGNOSIS — O0990 Supervision of high risk pregnancy, unspecified, unspecified trimester: Secondary | ICD-10-CM | POA: Diagnosis not present

## 2019-12-01 DIAGNOSIS — Z362 Encounter for other antenatal screening follow-up: Secondary | ICD-10-CM | POA: Diagnosis not present

## 2019-12-01 DIAGNOSIS — O4403 Placenta previa specified as without hemorrhage, third trimester: Secondary | ICD-10-CM

## 2019-12-01 DIAGNOSIS — O09523 Supervision of elderly multigravida, third trimester: Secondary | ICD-10-CM

## 2019-12-01 DIAGNOSIS — O24410 Gestational diabetes mellitus in pregnancy, diet controlled: Secondary | ICD-10-CM

## 2019-12-01 DIAGNOSIS — O24419 Gestational diabetes mellitus in pregnancy, unspecified control: Secondary | ICD-10-CM

## 2019-12-01 DIAGNOSIS — O365990 Maternal care for other known or suspected poor fetal growth, unspecified trimester, not applicable or unspecified: Secondary | ICD-10-CM

## 2019-12-04 ENCOUNTER — Encounter: Payer: Self-pay | Admitting: Nurse Practitioner

## 2019-12-04 DIAGNOSIS — O365931 Maternal care for other known or suspected poor fetal growth, third trimester, fetus 1: Secondary | ICD-10-CM | POA: Insufficient documentation

## 2019-12-05 ENCOUNTER — Ambulatory Visit (INDEPENDENT_AMBULATORY_CARE_PROVIDER_SITE_OTHER): Payer: Medicaid Other | Admitting: Nurse Practitioner

## 2019-12-05 ENCOUNTER — Other Ambulatory Visit: Payer: Self-pay

## 2019-12-05 ENCOUNTER — Other Ambulatory Visit (HOSPITAL_COMMUNITY)
Admission: RE | Admit: 2019-12-05 | Discharge: 2019-12-05 | Disposition: A | Discharge status: Home / Self Care | Payer: Medicaid Other | Source: Ambulatory Visit | Attending: Nurse Practitioner | Admitting: Nurse Practitioner

## 2019-12-05 VITALS — BP 103/69 | HR 80 | Wt 136.7 lb

## 2019-12-05 DIAGNOSIS — O0990 Supervision of high risk pregnancy, unspecified, unspecified trimester: Secondary | ICD-10-CM | POA: Insufficient documentation

## 2019-12-05 DIAGNOSIS — O24419 Gestational diabetes mellitus in pregnancy, unspecified control: Secondary | ICD-10-CM

## 2019-12-05 DIAGNOSIS — O4403 Placenta previa specified as without hemorrhage, third trimester: Secondary | ICD-10-CM

## 2019-12-05 DIAGNOSIS — O0993 Supervision of high risk pregnancy, unspecified, third trimester: Secondary | ICD-10-CM

## 2019-12-05 DIAGNOSIS — Z3A36 36 weeks gestation of pregnancy: Secondary | ICD-10-CM

## 2019-12-05 DIAGNOSIS — O365931 Maternal care for other known or suspected poor fetal growth, third trimester, fetus 1: Secondary | ICD-10-CM

## 2019-12-05 NOTE — Progress Notes (Signed)
BRx Glucose Readings:     

## 2019-12-05 NOTE — Progress Notes (Signed)
Subjective:  Jessica Ballard is a 39 y.o. G4P3003 at [redacted]w[redacted]d being seen today for ongoing prenatal care.  She is currently monitored for the following issues for this high-risk pregnancy and has Lipoma of scalp; Non-scarring alopecia; Family history of diabetes mellitus type II; IDA (iron deficiency anemia); Health care maintenance; Fatigue; Supervision of high risk pregnancy, antepartum; AMA (advanced maternal age) multigravida 35+; Gestational diabetes mellitus (GDM) affecting pregnancy; Placenta previa; and IUGR (intrauterine growth restriction) affecting care of mother, third trimester, fetus 1 on their problem list.  Patient reports no complaints.  Contractions: Not present. Vag. Bleeding: None.  Movement: Present. Denies leaking of fluid.   The following portions of the patient's history were reviewed and updated as appropriate: allergies, current medications, past family history, past medical history, past social history, past surgical history and problem list. Problem list updated.  Objective:   Vitals:   12/05/19 1050  BP: 103/69  Pulse: 80  Weight: 136 lb 11.2 oz (62 kg)    Fetal Status: Fetal Heart Rate (bpm): 138 Fundal Height: 30 cm Movement: Present  Presentation: Vertex  General:  Alert, oriented and cooperative. Patient is in no acute distress.  Skin: Skin is warm and dry. No rash noted.   Cardiovascular: Normal heart rate noted  Respiratory: Normal respiratory effort, no problems with respiration noted  Abdomen: Soft, gravid, appropriate for gestational age. Pain/Pressure: Present     Pelvic:  Cervical exam performed Dilation: Closed   Station: -3  Extremities: Normal range of motion.  Edema: None  Mental Status: Normal mood and affect. Normal behavior. Normal judgment and thought content.   Urinalysis:      Assessment and Plan:  Pregnancy: G4P3003 at [redacted]w[redacted]d  1. Supervision of high risk pregnancy, antepartum babyscripts data reviewed - blood sugars good, BPs  normal Weight gain of 30 pounds this pregnancy - client is eating healthy foods Fundal height is lagging behind consistent with IUGR  - Strep Gp B Culture+Rflx - GC/Chlamydia probe amp ([place])not at [facility]  2. Placenta previa in third trimester Has potentially moved 2 cm from os but MFM wants to review again at [place] on Friday.  Advises at this time vaginal birth may be considered.  3. IUGR (intrauterine growth restriction) affecting care of mother, third trimester, fetus 1 Baby in 3%tile  - mother was not aware although she has seen [place] results  - did not read the entire result in [place].  Was not aware that induction was recommended at 37-38 weeks.  Has missed having her partner at the [place] even though there has been phone communication with MFM at the time of the [place] - partner has been included.  Has a history of babies under 5.5 pounds and mother is very reassured by this although fundal height has not changed since her last visit.  Does not want to schedule induction for 37 weeks.  Does agree that induction at 38 weeks would be possible but wants to discuss with her partner and wants the [place] again on Friday with MFM before scheduling induction.  4. Gestational diabetes mellitus (GDM) affecting pregnancy Monitoring her blood sugars and is very aware of food intake - healthy choices  Preterm labor symptoms and general obstetric precautions including but not limited to vaginal bleeding, contractions, leaking of fluid and fetal movement were reviewed in detail with the patient. Please refer to After Visit Summary for other counseling recommendations.  Return in about 1 week (around 12/12/2019) for in person ROB with MD  for delivery plan and scheduling induction.  Earlie Server, RN, MSN, NP-BC Nurse Practitioner, [facility] for [employer], [place] [facility] 12/05/2019 11:26 AM

## 2019-12-06 LAB — GC/CHLAMYDIA PROBE AMP (~~LOC~~) NOT AT ARMC
Chlamydia: NEGATIVE
Comment: NEGATIVE
Comment: NORMAL
Neisseria Gonorrhea: NEGATIVE

## 2019-12-08 ENCOUNTER — Ambulatory Visit (HOSPITAL_COMMUNITY): Payer: Medicaid Other

## 2019-12-08 ENCOUNTER — Ambulatory Visit (HOSPITAL_COMMUNITY): Admission: RE | Admit: 2019-12-08 | Payer: Medicaid Other | Source: Ambulatory Visit

## 2019-12-10 LAB — STREP GP B SUSCEPTIBILITY

## 2019-12-10 LAB — STREP GP B CULTURE+RFLX: Strep Gp B Culture+Rflx: POSITIVE — AB

## 2019-12-11 ENCOUNTER — Encounter: Payer: Self-pay | Admitting: Nurse Practitioner

## 2019-12-11 DIAGNOSIS — O99820 Streptococcus B carrier state complicating pregnancy: Secondary | ICD-10-CM | POA: Insufficient documentation

## 2019-12-12 DIAGNOSIS — Z029 Encounter for administrative examinations, unspecified: Secondary | ICD-10-CM

## 2019-12-13 ENCOUNTER — Ambulatory Visit (HOSPITAL_COMMUNITY): Payer: Medicaid Other | Admitting: *Deleted

## 2019-12-13 ENCOUNTER — Ambulatory Visit (HOSPITAL_COMMUNITY)
Admission: RE | Admit: 2019-12-13 | Discharge: 2019-12-13 | Disposition: A | Discharge status: Home / Self Care | Payer: Medicaid Other | Source: Ambulatory Visit | Attending: Obstetrics | Admitting: Obstetrics

## 2019-12-13 ENCOUNTER — Ambulatory Visit (INDEPENDENT_AMBULATORY_CARE_PROVIDER_SITE_OTHER): Payer: Medicaid Other | Admitting: Family Medicine

## 2019-12-13 ENCOUNTER — Other Ambulatory Visit: Payer: Self-pay

## 2019-12-13 ENCOUNTER — Other Ambulatory Visit (HOSPITAL_COMMUNITY): Payer: Self-pay | Admitting: *Deleted

## 2019-12-13 ENCOUNTER — Encounter (HOSPITAL_COMMUNITY): Payer: Self-pay | Admitting: *Deleted

## 2019-12-13 VITALS — BP 108/75 | HR 84 | Wt 136.0 lb

## 2019-12-13 DIAGNOSIS — O09523 Supervision of elderly multigravida, third trimester: Secondary | ICD-10-CM

## 2019-12-13 DIAGNOSIS — O24419 Gestational diabetes mellitus in pregnancy, unspecified control: Secondary | ICD-10-CM

## 2019-12-13 DIAGNOSIS — O0990 Supervision of high risk pregnancy, unspecified, unspecified trimester: Secondary | ICD-10-CM

## 2019-12-13 DIAGNOSIS — Z3A37 37 weeks gestation of pregnancy: Secondary | ICD-10-CM | POA: Diagnosis not present

## 2019-12-13 DIAGNOSIS — O365930 Maternal care for other known or suspected poor fetal growth, third trimester, not applicable or unspecified: Secondary | ICD-10-CM

## 2019-12-13 DIAGNOSIS — O365990 Maternal care for other known or suspected poor fetal growth, unspecified trimester, not applicable or unspecified: Secondary | ICD-10-CM | POA: Insufficient documentation

## 2019-12-13 DIAGNOSIS — O4403 Placenta previa specified as without hemorrhage, third trimester: Secondary | ICD-10-CM | POA: Diagnosis not present

## 2019-12-13 DIAGNOSIS — O99820 Streptococcus B carrier state complicating pregnancy: Secondary | ICD-10-CM

## 2019-12-13 DIAGNOSIS — O24410 Gestational diabetes mellitus in pregnancy, diet controlled: Secondary | ICD-10-CM | POA: Diagnosis not present

## 2019-12-13 DIAGNOSIS — O365931 Maternal care for other known or suspected poor fetal growth, third trimester, fetus 1: Secondary | ICD-10-CM

## 2019-12-13 NOTE — Progress Notes (Signed)
    PRENATAL VISIT NOTE  Subjective:  Jessica Ballard is a 39 y.o. G4P3003 at [redacted]w[redacted]d being seen today for ongoing prenatal care.  She is currently monitored for the following issues for this high-risk pregnancy and has Lipoma of scalp; Non-scarring alopecia; Family history of diabetes mellitus type II; IDA (iron deficiency anemia); Health care maintenance; Fatigue; Supervision of high risk pregnancy, antepartum; AMA (advanced maternal age) multigravida 35+; Gestational diabetes mellitus (GDM) affecting pregnancy; Placenta previa; IUGR (intrauterine growth restriction) affecting care of mother, third trimester, fetus 1; and Group B Streptococcus carrier, +RV culture, currently pregnant on their problem list.  Patient reports no complaints.  Contractions: Not present. Vag. Bleeding: None.  Movement: Present. Denies leaking of fluid.   The following portions of the patient's history were reviewed and updated as appropriate: allergies, current medications, past family history, past medical history, past social history, past surgical history and problem list.   Objective:   Vitals:   12/13/19 1346  BP: 108/75  Pulse: 84  Weight: 136 lb (61.7 kg)    Fetal Status: Fetal Heart Rate (bpm): 128   Movement: Present     General:  Alert, oriented and cooperative. Patient is in no acute distress.  Skin: Skin is warm and dry. No rash noted.   Cardiovascular: Normal heart rate noted  Respiratory: Normal respiratory effort, no problems with respiration noted  Abdomen: Soft, gravid, appropriate for gestational age.  Pain/Pressure: Present     Pelvic: Cervical exam performed in the presence of a chaperone Dilation: 1.5 Effacement (%): 50 Station: -2  Extremities: Normal range of motion.  Edema: None  Mental Status: Normal mood and affect. Normal behavior. Normal judgment and thought content.   Assessment and Plan:  Pregnancy: G4P3003 at [redacted]w[redacted]d 1. Supervision of high risk pregnancy, antepartum   2.  Gestational diabetes mellitus (GDM) affecting pregnancy Completely normal CBGs On diet  3. Multigravida of advanced maternal age in third trimester Will turn 29 on 3/16  4. IUGR (intrauterine growth restriction) affecting care of mother, third trimester, fetus 1 Likely constitutionally small, w/ Nml fluid and nml dopplers and h/o small babies Declines IOL at this time.  5. Group B Streptococcus carrier, +RV culture, currently pregnant Discussed at length with pt.  6. Low lying placenta 1.6 cm on TVUS from os. Discussed with Dr. Annamaria Boots MFM and ok to attempt TOL  Preterm labor symptoms and general obstetric precautions including but not limited to vaginal bleeding, contractions, leaking of fluid and fetal movement were reviewed in detail with the patient. Please refer to After Visit Summary for other counseling recommendations.   Return in 1 week (on 12/20/2019) for in person when has u/s.  Future Appointments  Date Time Provider [place]  12/22/2019  8:00 AM [place] [facility]  12/22/2019  8:00 AM [place] 3 WH-MFCUS M[facility]-US  12/25/2019  2:15 PM [place], DO [facility] W[facility]    Donnamae Jude, MD

## 2019-12-13 NOTE — Patient Instructions (Signed)

## 2019-12-15 ENCOUNTER — Ambulatory Visit (HOSPITAL_COMMUNITY): Payer: Medicaid Other

## 2019-12-22 ENCOUNTER — Ambulatory Visit (HOSPITAL_COMMUNITY): Payer: Medicaid Other | Admitting: *Deleted

## 2019-12-22 ENCOUNTER — Other Ambulatory Visit: Payer: Self-pay

## 2019-12-22 ENCOUNTER — Ambulatory Visit (HOSPITAL_COMMUNITY)
Admission: RE | Admit: 2019-12-22 | Discharge: 2019-12-22 | Disposition: A | Discharge status: Home / Self Care | Payer: Medicaid Other | Source: Ambulatory Visit | Attending: Obstetrics | Admitting: Obstetrics

## 2019-12-22 ENCOUNTER — Other Ambulatory Visit (HOSPITAL_COMMUNITY): Payer: Self-pay | Admitting: Obstetrics

## 2019-12-22 ENCOUNTER — Encounter (HOSPITAL_COMMUNITY): Payer: Self-pay

## 2019-12-22 DIAGNOSIS — O365990 Maternal care for other known or suspected poor fetal growth, unspecified trimester, not applicable or unspecified: Secondary | ICD-10-CM | POA: Diagnosis not present

## 2019-12-22 DIAGNOSIS — O09523 Supervision of elderly multigravida, third trimester: Secondary | ICD-10-CM | POA: Diagnosis not present

## 2019-12-22 DIAGNOSIS — O0990 Supervision of high risk pregnancy, unspecified, unspecified trimester: Secondary | ICD-10-CM | POA: Insufficient documentation

## 2019-12-22 DIAGNOSIS — O365930 Maternal care for other known or suspected poor fetal growth, third trimester, not applicable or unspecified: Secondary | ICD-10-CM

## 2019-12-22 DIAGNOSIS — O24419 Gestational diabetes mellitus in pregnancy, unspecified control: Secondary | ICD-10-CM | POA: Insufficient documentation

## 2019-12-23 DIAGNOSIS — Z23 Encounter for immunization: Secondary | ICD-10-CM | POA: Diagnosis not present

## 2019-12-25 ENCOUNTER — Ambulatory Visit (INDEPENDENT_AMBULATORY_CARE_PROVIDER_SITE_OTHER): Payer: Medicaid Other | Admitting: Nurse Practitioner

## 2019-12-25 ENCOUNTER — Telehealth: Payer: Self-pay

## 2019-12-25 ENCOUNTER — Other Ambulatory Visit: Payer: Self-pay

## 2019-12-25 VITALS — BP 118/71 | HR 84 | Wt 138.6 lb

## 2019-12-25 DIAGNOSIS — O09523 Supervision of elderly multigravida, third trimester: Secondary | ICD-10-CM

## 2019-12-25 DIAGNOSIS — Z3A39 39 weeks gestation of pregnancy: Secondary | ICD-10-CM

## 2019-12-25 DIAGNOSIS — Z23 Encounter for immunization: Secondary | ICD-10-CM | POA: Diagnosis not present

## 2019-12-25 DIAGNOSIS — O365931 Maternal care for other known or suspected poor fetal growth, third trimester, fetus 1: Secondary | ICD-10-CM

## 2019-12-25 DIAGNOSIS — O0990 Supervision of high risk pregnancy, unspecified, unspecified trimester: Secondary | ICD-10-CM

## 2019-12-25 DIAGNOSIS — O24419 Gestational diabetes mellitus in pregnancy, unspecified control: Secondary | ICD-10-CM

## 2019-12-25 NOTE — Progress Notes (Signed)
Glucose readings on BRx:

## 2019-12-25 NOTE — Telephone Encounter (Signed)
Called pt to advise of Induction date of 12/29/19,no answer, left VM advised that they will be calling her with the exact time of induction.

## 2019-12-25 NOTE — Patient Instructions (Signed)
Fetal Movement Counts Patient Name: ________________________________________________ Patient Due Date: ____________________ What is a fetal movement count?  A fetal movement count is the number of times that you feel your baby move during a certain amount of time. This may also be called a fetal kick count. A fetal movement count is recommended for every pregnant woman. You may be asked to start counting fetal movements as early as week 28 of your pregnancy. Pay attention to when your baby is most active. You may notice your baby's sleep and wake cycles. You may also notice things that make your baby move more. You should do a fetal movement count:  When your baby is normally most active.  At the same time each day. A good time to count movements is while you are resting, after having something to eat and drink. How do I count fetal movements? 1. Find a quiet, comfortable area. Sit, or lie down on your side. 2. Write down the date, the start time and stop time, and the number of movements that you felt between those two times. Take this information with you to your health care visits. 3. Write down your start time when you feel the first movement. 4. Count kicks, flutters, swishes, rolls, and jabs. You should feel at least 10 movements. 5. You may stop counting after you have felt 10 movements, or if you have been counting for 2 hours. Write down the stop time. 6. If you do not feel 10 movements in 2 hours, contact your health care provider for further instructions. Your health care provider may want to do additional tests to assess your baby's well-being. Contact a health care provider if:  You feel fewer than 10 movements in 2 hours.  Your baby is not moving like he or she usually does. Date: ____________ Start time: ____________ Stop time: ____________ Movements: ____________ Date: ____________ Start time: ____________ Stop time: ____________ Movements: ____________ Date: ____________  Start time: ____________ Stop time: ____________ Movements: ____________ Date: ____________ Start time: ____________ Stop time: ____________ Movements: ____________ Date: ____________ Start time: ____________ Stop time: ____________ Movements: ____________ Date: ____________ Start time: ____________ Stop time: ____________ Movements: ____________ Date: ____________ Start time: ____________ Stop time: ____________ Movements: ____________ Date: ____________ Start time: ____________ Stop time: ____________ Movements: ____________ Date: ____________ Start time: ____________ Stop time: ____________ Movements: ____________ This information is not intended to replace advice given to you by your health care provider. Make sure you discuss any questions you have with your health care provider. Document Revised: 05/04/2019 Document Reviewed: 05/04/2019 Elsevier Patient Education  2020 Elsevier Inc.  

## 2019-12-25 NOTE — Progress Notes (Signed)
Subjective:  Jessica Ballard is a 39 y.o. G4P3003 at [redacted]w[redacted]d being seen today for ongoing prenatal care.  She is currently monitored for the following issues for this high-risk pregnancy and has Lipoma of scalp; Non-scarring alopecia; Family history of diabetes mellitus type II; IDA (iron deficiency anemia); Health care maintenance; Fatigue; Supervision of high risk pregnancy, antepartum; AMA (advanced maternal age) multigravida 35+; Gestational diabetes mellitus (GDM) affecting pregnancy; Placenta previa; IUGR (intrauterine growth restriction) affecting care of mother, third trimester, fetus 1; and Group B Streptococcus carrier, +RV culture, currently pregnant on their problem list.  Patient reports no complaints.  States she is eating very healthy foods and the baby has been moving a lot.  Contractions: Irritability. Vag. Bleeding: None.  Movement: Present. Denies leaking of fluid.   The following portions of the patient's history were reviewed and updated as appropriate: allergies, current medications, past family history, past medical history, past social history, past surgical history and problem list. Problem list updated.  Objective:   Vitals:   12/25/19 1017  BP: 118/71  Pulse: 84  Weight: 138 lb 9.6 oz (62.9 kg)    Fetal Status: Fetal Heart Rate (bpm): 123 Fundal Height: 34 cm Movement: Present     General:  Alert, oriented and cooperative. Patient is in no acute distress.  Skin: Skin is warm and dry. No rash noted.   Cardiovascular: Normal heart rate noted  Respiratory: Normal respiratory effort, no problems with respiration noted  Abdomen: Soft, gravid, appropriate for gestational age. Pain/Pressure: Present     Pelvic:  Cervical exam deferred        Extremities: Normal range of motion.  Edema: None  Mental Status: Normal mood and affect. Normal behavior. Normal judgment and thought content.   Urinalysis:      Assessment and Plan:  Pregnancy: G4P3003 at [redacted]w[redacted]d  1.  Supervision of high risk pregnancy, antepartum with advanced maternal age Blood pressures have been in normal range Baby continues to measure below expected for dates.  2. Gestational diabetes mellitus (GDM) affecting pregnancy Glucose levels in normal range.  3. IUGR (intrauterine growth restriction) affecting care of mother, third trimester, fetus 1 Consult with Dr. Nehemiah Settle on plan of care. Reviewed last ultrasounds and EFW on each for 12-02-19 and 12-22-19 and the drop in EFW from 3% to 1% between the 2 ultrasounds.  Advised that baby is not maintaining an appropriate interval gain as the percentile is dropping over time. Reviewed recommendation from MFM and advised setting up induction for tomorrow.  Reviewed process for gentle induction and answered client's questions.  Emphasized that baby is now term and could be born and would recommend induction tomorrow.  Client asked to wait until Friday.  Spoke with husband on the phone and FaceTime visit done where reviewed this information with him as well.  Client wanted to wait until 40 weeks for delivery but agrees to induction on Friday.  Advised if baby is not moving well, she should go directly to [facility] no matter what time of day.  Fetal kick count info given in AVS.  Foley bulb not indicated due to position of placenta.  Term labor symptoms and general obstetric precautions including but not limited to vaginal bleeding, contractions, leaking of fluid and fetal movement were reviewed in detail with the patient. Please refer to After Visit Summary for other counseling recommendations.  No follow-ups on file.  Will go for induction on Friday unless spontaneous labor occurs before then.  Client reports she is  scheduled to work for the next 2 days.  Earlie Server, RN, MSN, NP-BC Nurse Practitioner, [facility] for [employer], [place] [facility] 12/25/2019 11:03 AM

## 2019-12-26 ENCOUNTER — Telehealth (HOSPITAL_COMMUNITY): Payer: Self-pay | Admitting: *Deleted

## 2019-12-26 NOTE — Telephone Encounter (Signed)
Preadmission screen  

## 2019-12-27 ENCOUNTER — Other Ambulatory Visit (HOSPITAL_COMMUNITY)
Admission: RE | Admit: 2019-12-27 | Discharge: 2019-12-27 | Disposition: A | Discharge status: Home / Self Care | Payer: Medicaid Other | Source: Ambulatory Visit | Attending: Family Medicine | Admitting: Family Medicine

## 2019-12-27 ENCOUNTER — Other Ambulatory Visit (HOSPITAL_COMMUNITY): Payer: Self-pay | Admitting: Obstetrics

## 2019-12-27 DIAGNOSIS — O24410 Gestational diabetes mellitus in pregnancy, diet controlled: Secondary | ICD-10-CM | POA: Diagnosis not present

## 2019-12-27 DIAGNOSIS — O4403 Placenta previa specified as without hemorrhage, third trimester: Secondary | ICD-10-CM

## 2019-12-27 DIAGNOSIS — Z01812 Encounter for preprocedural laboratory examination: Secondary | ICD-10-CM | POA: Insufficient documentation

## 2019-12-27 DIAGNOSIS — Z362 Encounter for other antenatal screening follow-up: Secondary | ICD-10-CM | POA: Diagnosis not present

## 2019-12-27 DIAGNOSIS — O365930 Maternal care for other known or suspected poor fetal growth, third trimester, not applicable or unspecified: Secondary | ICD-10-CM | POA: Diagnosis not present

## 2019-12-27 DIAGNOSIS — Z20822 Contact with and (suspected) exposure to covid-19: Secondary | ICD-10-CM | POA: Diagnosis not present

## 2019-12-27 DIAGNOSIS — O09523 Supervision of elderly multigravida, third trimester: Secondary | ICD-10-CM

## 2019-12-27 DIAGNOSIS — Z3A38 38 weeks gestation of pregnancy: Secondary | ICD-10-CM

## 2019-12-27 LAB — SARS CORONAVIRUS 2 (TAT 6-24 HRS): SARS Coronavirus 2: NEGATIVE

## 2019-12-28 ENCOUNTER — Ambulatory Visit (HOSPITAL_COMMUNITY): Payer: Medicaid Other

## 2019-12-28 ENCOUNTER — Encounter (HOSPITAL_COMMUNITY): Payer: Self-pay

## 2019-12-29 ENCOUNTER — Inpatient Hospital Stay (HOSPITAL_COMMUNITY)
Admission: AD | Admit: 2019-12-29 | Discharge: 2019-12-31 | DRG: 806 | Disposition: A | Discharge status: Home / Self Care | Payer: Medicaid Other | Attending: Obstetrics & Gynecology | Admitting: Obstetrics & Gynecology

## 2019-12-29 ENCOUNTER — Inpatient Hospital Stay (HOSPITAL_COMMUNITY): Payer: Medicaid Other

## 2019-12-29 ENCOUNTER — Other Ambulatory Visit: Payer: Self-pay

## 2019-12-29 ENCOUNTER — Encounter (HOSPITAL_COMMUNITY): Payer: Self-pay | Admitting: Family Medicine

## 2019-12-29 DIAGNOSIS — O365930 Maternal care for other known or suspected poor fetal growth, third trimester, not applicable or unspecified: Secondary | ICD-10-CM | POA: Diagnosis present

## 2019-12-29 DIAGNOSIS — Z880 Allergy status to penicillin: Secondary | ICD-10-CM | POA: Diagnosis not present

## 2019-12-29 DIAGNOSIS — O24420 Gestational diabetes mellitus in childbirth, diet controlled: Secondary | ICD-10-CM | POA: Diagnosis not present

## 2019-12-29 DIAGNOSIS — O99824 Streptococcus B carrier state complicating childbirth: Secondary | ICD-10-CM | POA: Diagnosis not present

## 2019-12-29 DIAGNOSIS — O09529 Supervision of elderly multigravida, unspecified trimester: Secondary | ICD-10-CM

## 2019-12-29 DIAGNOSIS — O0990 Supervision of high risk pregnancy, unspecified, unspecified trimester: Secondary | ICD-10-CM

## 2019-12-29 DIAGNOSIS — O09523 Supervision of elderly multigravida, third trimester: Secondary | ICD-10-CM

## 2019-12-29 DIAGNOSIS — Z3A40 40 weeks gestation of pregnancy: Secondary | ICD-10-CM | POA: Diagnosis not present

## 2019-12-29 DIAGNOSIS — O24419 Gestational diabetes mellitus in pregnancy, unspecified control: Secondary | ICD-10-CM | POA: Diagnosis present

## 2019-12-29 DIAGNOSIS — O365931 Maternal care for other known or suspected poor fetal growth, third trimester, fetus 1: Secondary | ICD-10-CM | POA: Diagnosis present

## 2019-12-29 DIAGNOSIS — O4443 Low lying placenta NOS or without hemorrhage, third trimester: Secondary | ICD-10-CM | POA: Diagnosis present

## 2019-12-29 DIAGNOSIS — Z3A39 39 weeks gestation of pregnancy: Secondary | ICD-10-CM

## 2019-12-29 DIAGNOSIS — O99820 Streptococcus B carrier state complicating pregnancy: Secondary | ICD-10-CM

## 2019-12-29 DIAGNOSIS — O4440 Low lying placenta NOS or without hemorrhage, unspecified trimester: Secondary | ICD-10-CM

## 2019-12-29 LAB — CBC
HCT: 35.8 % — ABNORMAL LOW (ref 36.0–46.0)
Hemoglobin: 11.5 g/dL — ABNORMAL LOW (ref 12.0–15.0)
MCH: 25.7 pg — ABNORMAL LOW (ref 26.0–34.0)
MCHC: 32.1 g/dL (ref 30.0–36.0)
MCV: 80.1 fL (ref 80.0–100.0)
Platelets: 310 10*3/uL (ref 150–400)
RBC: 4.47 MIL/uL (ref 3.87–5.11)
RDW: 18.6 % — ABNORMAL HIGH (ref 11.5–15.5)
WBC: 12.8 10*3/uL — ABNORMAL HIGH (ref 4.0–10.5)
nRBC: 0 % (ref 0.0–0.2)

## 2019-12-29 LAB — GLUCOSE, CAPILLARY
Glucose-Capillary: 63 mg/dL — ABNORMAL LOW (ref 70–99)
Glucose-Capillary: 86 mg/dL (ref 70–99)

## 2019-12-29 LAB — TYPE AND SCREEN
ABO/RH(D): B POS
Antibody Screen: NEGATIVE

## 2019-12-29 LAB — ABO/RH: ABO/RH(D): B POS

## 2019-12-29 MED ORDER — LIDOCAINE HCL (PF) 1 % IJ SOLN
30.0000 mL | INTRAMUSCULAR | Status: AC | PRN
  Administered 2019-12-30: 30 mL via SUBCUTANEOUS
  Filled 2019-12-29: qty 30

## 2019-12-29 MED ORDER — OXYTOCIN BOLUS FROM INFUSION
500.0000 mL | Freq: Once | INTRAVENOUS | Status: AC
  Administered 2019-12-30: 500 mL via INTRAVENOUS

## 2019-12-29 MED ORDER — ONDANSETRON HCL 4 MG/2ML IJ SOLN: 4.0000 mg | Freq: Four times a day (QID) | INTRAMUSCULAR | Status: DC | PRN

## 2019-12-29 MED ORDER — BUTORPHANOL TARTRATE 1 MG/ML IJ SOLN
1.0000 mg | INTRAMUSCULAR | Status: DC | PRN
  Administered 2019-12-29: 1 mg via INTRAVENOUS
  Filled 2019-12-29: qty 1

## 2019-12-29 MED ORDER — OXYCODONE-ACETAMINOPHEN 5-325 MG PO TABS: 2.0000 | ORAL_TABLET | ORAL | Status: DC | PRN

## 2019-12-29 MED ORDER — LACTATED RINGERS IV SOLN: 500.0000 mL | INTRAVENOUS | Status: DC | PRN

## 2019-12-29 MED ORDER — OXYTOCIN 40 UNITS IN NORMAL SALINE INFUSION - SIMPLE MED: 2.5000 [IU]/h | INTRAVENOUS | Status: DC

## 2019-12-29 MED ORDER — ACETAMINOPHEN 325 MG PO TABS: 650.0000 mg | ORAL_TABLET | ORAL | Status: DC | PRN

## 2019-12-29 MED ORDER — FENTANYL CITRATE (PF) 100 MCG/2ML IJ SOLN: 100.0000 ug | INTRAMUSCULAR | Status: DC | PRN

## 2019-12-29 MED ORDER — LACTATED RINGERS IV SOLN: INTRAVENOUS | Status: DC

## 2019-12-29 MED ORDER — OXYTOCIN 40 UNITS IN NORMAL SALINE INFUSION - SIMPLE MED
1.0000 m[IU]/min | INTRAVENOUS | Status: DC
  Administered 2019-12-29: 2 m[IU]/min via INTRAVENOUS
  Filled 2019-12-29: qty 1000

## 2019-12-29 MED ORDER — TERBUTALINE SULFATE 1 MG/ML IJ SOLN: 0.2500 mg | Freq: Once | INTRAMUSCULAR | Status: DC | PRN

## 2019-12-29 MED ORDER — VANCOMYCIN HCL IN DEXTROSE 1-5 GM/200ML-% IV SOLN
1000.0000 mg | Freq: Two times a day (BID) | INTRAVENOUS | Status: DC
  Administered 2019-12-29: 1000 mg via INTRAVENOUS
  Filled 2019-12-29: qty 200

## 2019-12-29 MED ORDER — SOD CITRATE-CITRIC ACID 500-334 MG/5ML PO SOLN: 30.0000 mL | ORAL | Status: DC | PRN

## 2019-12-29 MED ORDER — OXYCODONE-ACETAMINOPHEN 5-325 MG PO TABS: 1.0000 | ORAL_TABLET | ORAL | Status: DC | PRN

## 2019-12-29 MED ORDER — FLEET ENEMA 7-19 GM/118ML RE ENEM: 1.0000 | ENEMA | RECTAL | Status: DC | PRN

## 2019-12-29 NOTE — Progress Notes (Signed)
Labor Progress Note Marianna Elander is a 39 y.o. G4P3003 at [redacted]w[redacted]d presented for IOL for GDMA1, AMA, FGR S: standing up beside bed.    O:  BP 112/77   Pulse 83   Temp 98.4 F (36.9 C) (Oral)   Resp 19   Ht 5\' 4"  (1.626 m)   Wt 63 kg   LMP 02/08/2019 (Within Days)   BMI 23.82 kg/m  EFM: 140/early decels/q2 minutes  CVE: Dilation: 5.5 Effacement (%): 50 Cervical Position: Middle Station: -2 Presentation: Vertex Exam by:: Darrol Poke, CNM and Dr. Jeannine Kitten   A&P: 39 y.o. [id number] [redacted]w[redacted]d here for IOL for GDMA1, AMA, FGR #Labor: Progressing well. Minimal change in dilation. Membranes ruptures soon after cervical exam, w/ clear fluid.  Continue pitocin.  #Pain: per patient request #FWB: cat I #GBS positive; vanc #GDMA1 - last glucose 63   Benay Pike, MD 10:07 PM

## 2019-12-29 NOTE — H&P (Signed)
OBSTETRIC ADMISSION HISTORY AND PHYSICAL  Jessica Ballard is a 39 y.o. female [phone] with IUP at 34w4dby LMP presenting for IOL for GDMA1, FGR, AMA. She reports +FMs, No LOF, no VB, no blurry vision, headaches or peripheral edema, and RUQ pain.  She plans on breast feeding. She requests OCPs vs Nuvaring for birth control. [facility]  Dating: By LMP --->  Estimated Date of Delivery: 01/01/20  Sono: 3/31   '@[redacted]w[redacted]d'$ , CWD, normal anatomy, cephalic presentation, low-lying placenta, 2408g, 1% EFW  Prenatal History/Complications: Placenta previa now low lying placenta 1.6 cm from os GDMA1 AMA FGR GBS pos  Past Medical History: Past Medical History:  Diagnosis Date  . Anemia   . Contraception 04/03/2016  . Loss of weight 03/14/2013  . Normal delivery 09/04/2011  . Underweight 03/17/2013    Past Surgical History: Past Surgical History:  Procedure Laterality Date  . NO PAST SURGERIES      Obstetrical History: OB History    Gravida  4   Para  3   Term  3   Preterm      AB      Living  3     SAB      TAB      Ectopic      Multiple      Live Births  3           Social History: Social History   Socioeconomic History  . Marital status: Married    Spouse name: DWeb designer . Number of children: 3  . Years of education: Not on file  . Highest education level: Not on file  Occupational History    Employer: [employer]  Tobacco Use  . Smoking status: Never Smoker  . Smokeless tobacco: Never Used  Substance and Sexual Activity  . Alcohol use: No  . Drug use: No  . Sexual activity: Yes    Birth control/protection: None  Other Topics Concern  . Not on file  Social History Narrative  . Not on file   Social Determinants of Health   Financial Resource Strain:   . Difficulty of Paying Living Expenses:   Food Insecurity: No Food Insecurity  . Worried About RCharity fundraiserin the Last Year: Never true  . Ran Out of Food in the Last  Year: Never true  Transportation Needs: No Transportation Needs  . Lack of Transportation (Medical): No  . Lack of Transportation (Non-Medical): No  Physical Activity:   . Days of Exercise per Week:   . Minutes of Exercise per Session:   Stress:   . Feeling of Stress :   Social Connections:   . Frequency of Communication with Friends and Family:   . Frequency of Social Gatherings with Friends and Family:   . Attends Religious Services:   . Active Member of Clubs or Organizations:   . Attends CArchivistMeetings:   .Marland KitchenMarital Status:     Family History: Family History  Problem Relation Age of Onset  . Diabetes Mother   . Kidney disease Mother   . Diabetes Maternal Aunt   . Diabetes Maternal Uncle   . Diabetes Maternal Grandmother   . Diabetes Maternal Grandfather     Allergies: Allergies  Allergen Reactions  . Penicillins Hives  . Zolpidem Tartrate Other (See Comments)    Hallucinations/bizarre behavior    Medications Prior to Admission  Medication Sig Dispense Refill Last Dose  . aspirin  EC 81 MG tablet Take 1 tablet (81 mg total) by mouth daily. 60 tablet 3 Past Week at Unknown time  . [employer] IRON 325 (65 Fe) MG tablet TAKE 1 TABLET BY MOUTH THREE TIMES A DAY 90 tablet 1 Past Week at Unknown time  . Prenatal Vit-Fe Fumarate-FA (GOODSENSE PRENATAL VITAMINS) 28-0.8 MG TABS Take 1 tablet by mouth daily. 30 tablet 3 12/28/2019 at Unknown time  . Accu-Chek FastClix Lancets MISC 1 each by Percutaneous route 4 (four) times daily. 100 each 12   . Blood Pressure Monitoring (BLOOD PRESSURE KIT) DEVI 1 Device by Does not apply route as needed. 1 Device 0   . glucose blood (ACCU-CHEK GUIDE) test strip Use as instructed 100 each 12      Review of Systems   All systems reviewed and negative except as stated in HPI  Blood pressure 121/75, pulse 78, resp. rate 16, height '5\' 4"'$  (1.626 m), weight 63 kg, last menstrual period 02/08/2019. General appearance: alert, cooperative  and appears stated age Lungs: normal effort Heart: regular rate  Abdomen: soft, non-tender; bowel sounds normal Pelvic: gravid uterus GU: No vaginal lesions  Extremities: Homans sign is negative, no sign of DVT Presentation: cephalic Fetal monitoringBaseline: 130 bpm, Variability: Good {> 6 bpm), Accelerations: Reactive and Decelerations: Absent Uterine activity: Frequency: Every 4 minutes Dilation: 1.5 Effacement (%): 50 Station: -3 Exam by:: Mary Martinique Johnson, RN    Prenatal labs: ABO, Rh: --/--/B POS (04/02 1334) Antibody: NEG (04/02 1334) Rubella: 13.60 (11/10 0831) RPR: Non Reactive (01/12 0915)  HBsAg: Negative (11/10 0831)  HIV: Non Reactive (01/12 0915)  GBS: Positive/-- (03/09 1036)  2 hr Glucola abnormal Genetic screening  declined Anatomy US WNL with previa  Prenatal Transfer Tool  Maternal Diabetes: Yes:  Diabetes Type:  Diet controlled Genetic Screening: Declined Maternal Ultrasounds/Referrals: Low lying placenta and FGR Fetal Ultrasounds or other Referrals:  None Maternal Substance Abuse:  No Significant Maternal Medications:  None Significant Maternal Lab Results: Group B Strep positive  Results for orders placed or performed during the hospital encounter of 12/29/19 (from the past 24 hour(s))  CBC   Collection Time: 12/29/19  1:34 PM  Result Value Ref Range   WBC 12.8 (H) 4.0 - 10.5 K/uL   RBC 4.47 3.87 - 5.11 MIL/uL   Hemoglobin 11.5 (L) 12.0 - 15.0 g/dL   HCT 35.8 (L) 36.0 - 46.0 %   MCV 80.1 80.0 - 100.0 fL   MCH 25.7 (L) 26.0 - 34.0 pg   MCHC 32.1 30.0 - 36.0 g/dL   RDW 18.6 (H) 11.5 - 15.5 %   Platelets 310 150 - 400 K/uL   nRBC 0.0 0.0 - 0.2 %  Type and screen   Collection Time: 12/29/19  1:34 PM  Result Value Ref Range   ABO/RH(D) B POS    Antibody Screen NEG    Sample Expiration      01/01/2020,2359 Performed at [facility], [address], [place], [place] [address]     Patient Active Problem List   Diagnosis Date Noted   . Low-lying placenta 12/29/2019  . Group B Streptococcus carrier, +RV culture, currently pregnant 12/11/2019  . IUGR (intrauterine growth restriction) affecting care of mother, third trimester, fetus 1 12/04/2019  . Placenta previa 08/28/2019  . Gestational diabetes mellitus (GDM) affecting pregnancy 08/11/2019  . Supervision of high risk pregnancy, antepartum 08/01/2019  . AMA (advanced maternal age) multigravida 35+ 08/01/2019  . Fatigue 02/25/2017  . Health care maintenance  04/03/2016  . IDA (iron deficiency anemia) 03/17/2013  . Lipoma of scalp 01/06/2012  . Non-scarring alopecia 01/06/2012  . Family history of diabetes mellitus type II 01/06/2012    Assessment/Plan:  Cassidee Deats is a 39 y.o. G4P3003 at 51w4dhere for IOL for GDMA1, FGR, AMA.  #Labor: Vertex by exam. Pitocin started. Foley bulb placed manually and filled with 60 mL water; patient tolerated well. Anticipate SVD.  #Pain: Per patient request #FWB: Cat I; EFW: 2500g #ID:  GBS pos; [place] resistant with PCN allergy> Vanc #MOF: Breast #MOC: Nuvaring vs OCPs #Circ:  Inpatient #GDMA1: well-controlled; CBG's q6h latent, q4h active   CBarrington Ellison MD [facility] CenterFamily Medicine Fellow, [facility] Hospitalfor [facility] C[facility]inGroup 12/29/2019, 2:43 PM

## 2019-12-29 NOTE — Progress Notes (Signed)
Labor Progress Note Trust Sale is a 39 y.o. G4P3003 at [redacted]w[redacted]d presented for IOL for GDMA1, AMA, FGR. S: Walking around room, feeling ctx. Foley out.   O:  BP 117/79   Pulse 80   Temp 97.9 F (36.6 C) (Oral)   Resp 16   Ht 5\' 4"  (1.626 m)   Wt 63 kg   LMP 02/08/2019 (Within Days)   BMI 23.82 kg/m  EFM: 140, moderate variability, pos accels, no decels, reactive TOCO: q2-51m  CVE: Dilation: 5 Effacement (%): 50 Cervical Position: Middle Station: -2 Presentation: Vertex Exam by:: Mary Martinique Johnson, RN    A&P: 39 y.o. [phone] [redacted]w[redacted]d here for IOL for FGR, [place], AMA. #Labor: Progressing well. S/p Foley bulb. Cont Pit. AROM PRN. Anticipate SVD.  #Pain: per patient request #FWB: Cat I #GBS positive; Vanc #GDMA1: last glucose 63  Chauncey Mann, MD 7:02 PM

## 2019-12-30 ENCOUNTER — Encounter (HOSPITAL_COMMUNITY): Payer: Self-pay | Admitting: Family Medicine

## 2019-12-30 DIAGNOSIS — O24420 Gestational diabetes mellitus in childbirth, diet controlled: Principal | ICD-10-CM

## 2019-12-30 DIAGNOSIS — O365931 Maternal care for other known or suspected poor fetal growth, third trimester, fetus 1: Secondary | ICD-10-CM

## 2019-12-30 DIAGNOSIS — O09523 Supervision of elderly multigravida, third trimester: Secondary | ICD-10-CM

## 2019-12-30 DIAGNOSIS — O99824 Streptococcus B carrier state complicating childbirth: Secondary | ICD-10-CM

## 2019-12-30 DIAGNOSIS — Z3A40 40 weeks gestation of pregnancy: Secondary | ICD-10-CM

## 2019-12-30 LAB — RPR: RPR Ser Ql: NONREACTIVE

## 2019-12-30 MED ORDER — PRENATAL MULTIVITAMIN CH
1.0000 | ORAL_TABLET | Freq: Every day | ORAL | Status: DC
  Administered 2019-12-30 – 2019-12-31 (×2): 1 via ORAL
  Filled 2019-12-30 (×2): qty 1

## 2019-12-30 MED ORDER — ACETAMINOPHEN 325 MG PO TABS: 650.0000 mg | ORAL_TABLET | ORAL | Status: DC | PRN

## 2019-12-30 MED ORDER — TETANUS-DIPHTH-ACELL PERTUSSIS 5-2.5-18.5 LF-MCG/0.5 IM SUSP: 0.5000 mL | Freq: Once | INTRAMUSCULAR | Status: DC

## 2019-12-30 MED ORDER — SENNOSIDES-DOCUSATE SODIUM 8.6-50 MG PO TABS
2.0000 | ORAL_TABLET | ORAL | Status: DC
  Administered 2019-12-30: 2 via ORAL
  Filled 2019-12-30: qty 2

## 2019-12-30 MED ORDER — WITCH HAZEL-GLYCERIN EX PADS: 1.0000 "application " | MEDICATED_PAD | CUTANEOUS | Status: DC | PRN

## 2019-12-30 MED ORDER — ONDANSETRON HCL 4 MG PO TABS: 4.0000 mg | ORAL_TABLET | ORAL | Status: DC | PRN

## 2019-12-30 MED ORDER — DIBUCAINE (PERIANAL) 1 % EX OINT: 1.0000 "application " | TOPICAL_OINTMENT | CUTANEOUS | Status: DC | PRN

## 2019-12-30 MED ORDER — DIPHENHYDRAMINE HCL 25 MG PO CAPS: 25.0000 mg | ORAL_CAPSULE | Freq: Four times a day (QID) | ORAL | Status: DC | PRN

## 2019-12-30 MED ORDER — SIMETHICONE 80 MG PO CHEW: 80.0000 mg | CHEWABLE_TABLET | ORAL | Status: DC | PRN

## 2019-12-30 MED ORDER — BENZOCAINE-MENTHOL 20-0.5 % EX AERO
1.0000 "application " | INHALATION_SPRAY | CUTANEOUS | Status: DC | PRN
  Administered 2019-12-30: 1 via TOPICAL
  Filled 2019-12-30: qty 56

## 2019-12-30 MED ORDER — ONDANSETRON HCL 4 MG/2ML IJ SOLN: 4.0000 mg | INTRAMUSCULAR | Status: DC | PRN

## 2019-12-30 MED ORDER — COCONUT OIL OIL
1.0000 "application " | TOPICAL_OIL | Status: DC | PRN
  Administered 2019-12-31: 1 via TOPICAL

## 2019-12-30 MED ORDER — IBUPROFEN 600 MG PO TABS
600.0000 mg | ORAL_TABLET | Freq: Four times a day (QID) | ORAL | Status: DC
  Administered 2019-12-30 – 2019-12-31 (×5): 600 mg via ORAL
  Filled 2019-12-30 (×5): qty 1

## 2019-12-30 NOTE — Lactation Note (Signed)
This note was copied from a baby's chart. Lactation Consultation Note  Patient Name: Boy Annalycia Coryell [id number] Date: 12/30/2019 Reason for consult: Initial assessment;Term  P4 mother whose infant is now 8 hours old.  This is a term baby at 39+5 weeks.  Mother breast fed her other children ( now 19, 57 and 11) for 9 months-14 months each.  Mother will be returning to work in 2 months but plans to breast/bottle feed.  Baby was asleep in the bassinet when I arrived.  Mother had no immediate questions/concerns related to breast feeding.  Encouraged feeding at least 8-12 times/24 hours or sooner if baby shows feeding cues.  Mother is familiar with hand expression and is able to obtain colostrum drops.  Container provided and milk storage times reviewed.  Finger feeding demonstrated.    Due to baby's weight I offered to initiate the DEBP for stimulation and supplementation with her colostrum drops.  Mother is not interested in doing this currently.  She asked, "Will it hurt?"  I reassured her that it will not hurt, however, she will feel a gentle tug.  Mother wants to wait and see how he does with his next feeding.  Encouraged her to call me when she is ready to initiate the DEBP.  Informed her that he will probably be supplementing with either donor milk or higher calorie formula after 24 hours and that I would prefer he get her EBM as soon as possible.  Mother verbalized understanding.  RN updated.  She had also offered to initiate the pump for mother this morning.  Mom made aware of O/P services, breastfeeding support groups, community resources, and our phone # for post-discharge questions. No support person present at this time.  Mother does not have a DEBP for home use.  [place] referral faxed to [facility] office.  Mother will follow up with a phone call early Monday morning.   Maternal Data Formula Feeding for Exclusion: No Has patient been taught Hand Expression?: Yes Does the patient have  breastfeeding experience prior to this delivery?: Yes  Feeding    LATCH Score                   Interventions    Lactation Tools Discussed/Used WIC Program: Yes Pump Review: Setup, frequency, and cleaning Initiated by:: RN   Consult Status Consult Status: Follow-up Date: 12/30/19 Follow-up type: In-patient    Little Ishikawa 12/30/2019, 12:42 PM

## 2019-12-30 NOTE — Discharge Summary (Signed)
Postpartum Discharge Summary     Patient Name: Jessica Ballard DOB: Oct 28, 1980 MRN: [id number]  Date of admission: 12/29/2019 Delivering Provider: Lajean Manes   Date of discharge: 12/31/2019  Admitting diagnosis: IUGR (intrauterine growth restriction) affecting care of mother, third trimester, fetus 1 [O36.5931] Intrauterine pregnancy: [redacted]w[redacted]d    Secondary diagnosis:  Active Problems:   AMA (advanced maternal age) multigravida 35+   Gestational diabetes mellitus (GDM) affecting pregnancy   IUGR (intrauterine growth restriction) affecting care of mother, third trimester, fetus 1   Group B Streptococcus carrier, +RV culture, currently pregnant   Low-lying placenta   SVD (spontaneous vaginal delivery)  Additional problems: none     Discharge diagnosis: Term Pregnancy Delivered, GDM A1 and AMA                                                                                                Post partum procedures:None  Augmentation: Pitocin and Foley Balloon  Complications: None  Hospital course:  Induction of Labor With Vaginal Delivery   39y.o. yo G938 [id number] admitted to the hospital 12/29/2019 for induction of labor.  Indication for induction: A1 DM, AMA and FGR.  Patient had an uncomplicated labor course as follows: Membrane Rupture Time/Date: 9:46 PM ,12/29/2019   Intrapartum Procedures: Episiotomy: None [1]                                         Lacerations:  1st degree [2];Labial [10]  Patient had delivery of a Viable infant.  Information for the patient's newborn:  KhVara, Mairena0[[id number]]Delivery Method: Vaginal, Spontaneous(Filed from Delivery Summary)    12/30/2019  Details of delivery can be found in separate delivery note.  Patient had a routine postpartum course. Patient is discharged home 12/31/19. Delivery time: 12:32 AM    Magnesium Sulfate received: No BMZ received: No Rhophylac:N/A MMR:N/A Transfusion:No  Physical exam  Vitals:   12/30/19  1200 12/30/19 1605 12/30/19 2147 12/31/19 0544  BP: 102/70 111/76 99/65 104/71  Pulse: 78 93 86 78  Resp: '16 18 18 18  '$ Temp: 98.1 F (36.7 C) 98.2 F (36.8 C)  97.6 F (36.4 C)  TempSrc:  Oral  Oral  SpO2: 99%  99% 100%  Weight:      Height:       General: alert, cooperative and no distress Lochia: appropriate Uterine Fundus: firm Incision: N/A DVT Evaluation: No evidence of DVT seen on physical exam. Labs: Lab Results  Component Value Date   WBC 12.8 (H) 12/29/2019   HGB 11.5 (L) 12/29/2019   HCT 35.8 (L) 12/29/2019   MCV 80.1 12/29/2019   PLT 310 12/29/2019   CMP Latest Ref Rng & Units 02/25/2017  Glucose 65 - 99 mg/dL 98  BUN 6 - 20 mg/dL 12  Creatinine 0.57 - 1.00 mg/dL 0.81  Sodium 134 - 144 mmol/L 137  Potassium 3.5 - 5.2 mmol/L 4.7  Chloride 96 - 106 mmol/L 104  CO2 18 - 29  mmol/L 23  Calcium 8.7 - 10.2 mg/dL 8.7  Total Protein 6.1 - 8.1 g/dL -  Total Bilirubin 0.2 - 1.2 mg/dL -  Alkaline Phos 33 - 115 U/L -  AST 10 - 30 U/L -  ALT 6 - 29 U/L -   Edinburgh Score: Edinburgh Postnatal Depression Scale Screening Tool 12/30/2019  I have been able to laugh and see the funny side of things. 0  I have looked forward with enjoyment to things. 0  I have blamed myself unnecessarily when things went wrong. 0  I have been anxious or worried for no good reason. 0  I have felt scared or panicky for no good reason. 0  Things have been getting on top of me. 0  I have been so unhappy that I have had difficulty sleeping. 0  I have felt sad or miserable. 0  I have been so unhappy that I have been crying. 0  The thought of harming myself has occurred to me. 0  Edinburgh Postnatal Depression Scale Total 0    Discharge instruction: per After Visit Summary and "Baby and Me Booklet".  After visit meds:  Allergies as of 12/31/2019      Reactions   Penicillins Hives   Zolpidem Tartrate Other (See Comments)   Hallucinations/bizarre behavior      Medication List    STOP  taking these medications   Accu-Chek FastClix Lancets Misc   Accu-Chek Guide test strip Generic drug: glucose blood   aspirin EC 81 MG tablet   Blood Pressure Kit Devi     TAKE these medications   [employer] Iron 325 (65 FE) MG tablet Generic drug: ferrous sulfate TAKE 1 TABLET BY MOUTH THREE TIMES A DAY   GoodSense Prenatal Vitamins 28-0.8 MG Tabs Take 1 tablet by mouth daily.   ibuprofen 800 MG tablet Commonly known as: ADVIL Take 1 tablet (800 mg total) by mouth every 8 (eight) hours as needed.       Diet: routine diet  Activity: Advance as tolerated. Pelvic rest for 6 weeks.   Outpatient follow up:4 weeks Follow up Appt:No future appointments. Follow up Visit:    Please schedule this patient for Postpartum visit in: 4 weeks with the following provider: Any provider In-Person For C/S patients schedule nurse incision check in weeks 2 weeks: no High risk pregnancy complicated by: GDM, AMA, FGR Delivery mode:  SVD Anticipated Birth Control:  OCPs PP Procedures needed: 2 hour GTT  Schedule Integrated BH visit: no   Newborn Data: Live born female  Birth Weight: 2526g (5lbs 9.1oz) APGAR: 83, 9   Newborn Delivery   Birth date/time: 40/11/2019 00:32:00 Delivery type: Vaginal, Spontaneous      Baby Feeding: Breast Disposition:home with mother   Marcille Buffy DNP, CNM  12/31/19  12:19 PM

## 2019-12-31 LAB — GLUCOSE, CAPILLARY: Glucose-Capillary: 68 mg/dL — ABNORMAL LOW (ref 70–99)

## 2019-12-31 MED ORDER — IBUPROFEN 800 MG PO TABS: 800.0000 mg | ORAL_TABLET | Freq: Three times a day (TID) | ORAL | 0 refills | Status: DC | PRN

## 2019-12-31 NOTE — Lactation Note (Signed)
This note was copied from a baby's chart. Lactation Consultation Note  Patient Name: Jessica Ballard [id number] Date: 12/31/2019 Reason for consult: Follow-up assessment;Infant < 6lbs;Term Baby is 35 hours old/6% weight loss.  Mom reports that baby is latching well.  Discussed milk coming to volume and the prevention and treatment of engorgement.  Manual pump given with instructions.  Mom has a history of an abundant milk supply.  Baby will be followed up in office tomorrow.  No questions or concerns.  Reviewed outpatient services and encouraged to call prn.  Maternal Data    Feeding Feeding Type: Breast Fed  LATCH Score Latch: Grasps breast easily, tongue down, lips flanged, rhythmical sucking.  Audible Swallowing: A few with stimulation  Type of Nipple: Everted at rest and after stimulation  Comfort (Breast/Nipple): Soft / non-tender  Hold (Positioning): Assistance needed to correctly position infant at breast and maintain latch.  LATCH Score: 8  Interventions Interventions: Hand pump  Lactation Tools Discussed/Used     Consult Status Consult Status: Complete Follow-up type: Call as needed    Ave Filter 12/31/2019, 11:46 AM

## 2019-12-31 NOTE — Progress Notes (Signed)
Post Partum Day 1 SVD 12/30/2019 at 0032  Subjective: no complaints, up ad lib, voiding, tolerating PO and + flatus  Objective: Blood pressure 104/71, pulse 78, temperature 97.6 F (36.4 C), temperature source Oral, resp. rate 18, height 5\' 4"  (1.626 m), weight 63 kg, last menstrual period 02/08/2019, SpO2 100 %, unknown if currently breastfeeding.  Physical Exam:  General: alert, cooperative, appears stated age and no distress Lochia: appropriate Uterine Fundus: firm Incision: N/A DVT Evaluation: No evidence of DVT seen on physical exam.  Recent Labs    12/29/19 1334  HGB 11.5*  HCT 35.8*    Assessment/Plan: Plan for discharge tomorrow  AM fasting CBG = 68   LOS: 2 days   Darlina Rumpf, CNM 12/31/2019, 5:54 AM

## 2019-12-31 NOTE — Progress Notes (Signed)
STAT POCT CBG ordered at 0526. CBG was 68. Patient given 236 ml of cranberry juice given to patient.   Erlene Quan 5:44 AM 12/31/2019

## 2020-01-15 ENCOUNTER — Encounter: Payer: Self-pay | Admitting: *Deleted

## 2020-01-29 ENCOUNTER — Other Ambulatory Visit: Payer: Self-pay | Admitting: Student

## 2020-02-06 ENCOUNTER — Other Ambulatory Visit: Payer: Self-pay | Admitting: *Deleted

## 2020-02-06 DIAGNOSIS — Z8632 Personal history of gestational diabetes: Secondary | ICD-10-CM

## 2020-02-08 ENCOUNTER — Encounter: Payer: Self-pay | Admitting: Medical

## 2020-02-08 DIAGNOSIS — Z8759 Personal history of other complications of pregnancy, childbirth and the puerperium: Secondary | ICD-10-CM | POA: Insufficient documentation

## 2020-02-08 DIAGNOSIS — Z8632 Personal history of gestational diabetes: Secondary | ICD-10-CM

## 2020-02-08 HISTORY — DX: Personal history of gestational diabetes: Z86.32

## 2020-02-09 ENCOUNTER — Other Ambulatory Visit: Payer: Self-pay

## 2020-02-09 ENCOUNTER — Other Ambulatory Visit: Payer: Medicaid Other

## 2020-02-09 ENCOUNTER — Ambulatory Visit (INDEPENDENT_AMBULATORY_CARE_PROVIDER_SITE_OTHER): Payer: Medicaid Other | Admitting: Medical

## 2020-02-09 ENCOUNTER — Encounter: Payer: Self-pay | Admitting: Medical

## 2020-02-09 DIAGNOSIS — Z3009 Encounter for other general counseling and advice on contraception: Secondary | ICD-10-CM

## 2020-02-09 DIAGNOSIS — Z1389 Encounter for screening for other disorder: Secondary | ICD-10-CM

## 2020-02-09 DIAGNOSIS — Z8632 Personal history of gestational diabetes: Secondary | ICD-10-CM | POA: Diagnosis not present

## 2020-02-09 MED ORDER — GOODSENSE PRENATAL VITAMINS 28-0.8 MG PO TABS: 1.0000 | ORAL_TABLET | Freq: Every day | ORAL | 1 refills | Status: DC

## 2020-02-09 MED ORDER — NORETHINDRONE 0.35 MG PO TABS: 1.0000 | ORAL_TABLET | Freq: Every day | ORAL | 11 refills | Status: DC

## 2020-02-09 NOTE — Progress Notes (Signed)
    [place] Partum Visit Note  Jessica Ballard is a 39 y.o. [id number] female who presents for a postpartum visit. She is 5 weeks postpartum following a normal spontaneous vaginal delivery.  I have fully reviewed the prenatal and intrapartum course. The delivery was at 39/5 gestational weeks.  Anesthesia: local. Postpartum course has been normal. Baby is doing well. Baby is feeding by both breast and bottle - Carn[employer] Good Start. Bleeding staining only. Bowel function is normal. Bladder function is normal. Patient is not sexually active. Contraception method is abstinence. Postpartum depression screening: negative.  The following portions of the patient's history were reviewed and updated as appropriate: allergies, current medications, past family history, past medical history, past social history, past surgical history and problem list.  Review of Systems Pertinent items are noted in HPI.    Objective:  Blood pressure 116/85, pulse 74, height 5\' 4"  (1.626 m), weight 124 lb 14.4 oz (56.7 kg), currently breastfeeding.  General:  alert and cooperative   Breasts:  Deferred  Lungs: clear to auscultation bilaterally  Heart:  regular rate and rhythm, S1, S2 normal, no murmur, click, rub or gallop  Abdomen: Soft, non-tender   Vulva:  not evaluated  Vagina: not evaluated  Cervix:  not evaluated  Corpus: not examined  Adnexa:  not evaluated  Rectal Exam: Not performed.        Assessment:    Normal postpartum exam. H/O GDM   Plan:   Essential components of care per ACOG recommendations:  1.  Mood and well being: Patient with negative depression screening today. Reviewed local resources for support.  - Patient does not use tobacco.  - hx of drug use? No    2. Infant care and feeding:  -Patient currently breastmilk feeding? Yes If breastmilk feeding discussed return to work and pumping. If needed, patient was provided letter for work to allow for every 2-3 hr pumping breaks, and to be granted a  private location to express breastmilk and refrigerated area to store breastmilk. Reviewed importance of draining breast regularly to support lactation. -Social determinants of health (SDOH) reviewed in EPIC. No concerns.  3. Sexuality, contraception and birth spacing - Patient does not want a pregnancy in the next year.  Desired family size is 4 children.  - Reviewed forms of contraception in tiered fashion. Patient desired oral progesterone-only contraceptive today.   - Discussed birth spacing of 18 months  4. Sleep and fatigue -Encouraged family/partner/community support of 4 hrs of uninterrupted sleep to help with mood and fatigue  5. Physical Recovery  - Discussed patients delivery and complications - Patient had a 1st degree laceration, perineal healing reviewed. Patient expressed understanding - Patient has urinary incontinence? No  - Patient is safe to resume physical and sexual activity  6.  Health Maintenance - Last pap smear done 07/2019 and was normal with negative HPV.  7. Chronic Disease - 2 hour GTT today   Kerry Hough, PA-C [facility] for [employer], [place]

## 2020-02-09 NOTE — Patient Instructions (Signed)

## 2020-02-10 LAB — GLUCOSE TOLERANCE, 2 HOURS
Glucose, 2 hour: 64 mg/dL — ABNORMAL LOW (ref 65–139)
Glucose, GTT - Fasting: 81 mg/dL (ref 65–99)

## 2020-07-18 ENCOUNTER — Other Ambulatory Visit: Payer: Self-pay | Admitting: Student

## 2020-07-18 ENCOUNTER — Other Ambulatory Visit: Payer: Self-pay

## 2020-07-18 ENCOUNTER — Encounter: Payer: Self-pay | Admitting: Student

## 2020-07-18 ENCOUNTER — Ambulatory Visit (INDEPENDENT_AMBULATORY_CARE_PROVIDER_SITE_OTHER): Payer: Medicaid Other | Admitting: Student

## 2020-07-18 VITALS — BP 107/75 | HR 80 | Wt 129.4 lb

## 2020-07-18 DIAGNOSIS — M25562 Pain in left knee: Secondary | ICD-10-CM

## 2020-07-18 DIAGNOSIS — M25561 Pain in right knee: Secondary | ICD-10-CM | POA: Diagnosis not present

## 2020-07-18 DIAGNOSIS — G8929 Other chronic pain: Secondary | ICD-10-CM

## 2020-07-18 DIAGNOSIS — L659 Nonscarring hair loss, unspecified: Secondary | ICD-10-CM

## 2020-07-18 HISTORY — DX: Pain in right knee: M25.561

## 2020-07-18 MED ORDER — NORETHIN ACE-ETH ESTRAD-FE 1-20 MG-MCG(24) PO CHEW: CHEWABLE_TABLET | ORAL | 11 refills | Status: DC

## 2020-07-18 MED ORDER — GOODSENSE PRENATAL VITAMINS 28-0.8 MG PO TABS: 1.0000 | ORAL_TABLET | Freq: Every day | ORAL | 6 refills | Status: DC

## 2020-07-18 NOTE — Progress Notes (Addendum)
  History:  Ms. Jessica Ballard is a 39 y.o. [phone] who presents to clinic today for annual exam. She is doing well and says that she has not had any complications since her delivery in April. She is still breast feeding but is planning to begin weaning soon. She is concerned about her abdominal muscles not returning to normal. She also reports hair loss since delivery. She has been experiencing bilateral knee pain that began towards the end of her pregnancy. She is interested in starting birth control and did not ever start the Micronor pill prescribed in May.   The following portions of the patient's history were reviewed and updated as appropriate: allergies, current medications, family history, past medical history, social history, past surgical history and problem list.  Review of Systems:   Review of Systems - General ROS: negative for - fatigue, sleep disturbance or weight gain Musculoskeletal ROS: positive for - pain in knee - bilateral   Objective:  Physical Exam BP 107/75   Pulse 80   Wt 129 lb 6.4 oz (58.7 kg)   Breastfeeding Yes   BMI 22.21 kg/m  Physical Exam Constitutional:      Appearance: Normal appearance. She is normal weight.  Cardiovascular:     Rate and Rhythm: Normal rate and regular rhythm.     Heart sounds: Normal heart sounds. No murmur heard.  No friction rub. No gallop.   Pulmonary:     Effort: Pulmonary effort is normal.     Breath sounds: Normal breath sounds. No stridor. No wheezing, rhonchi or rales.  Abdominal:     Palpations: Abdomen is soft.     Tenderness: There is no abdominal tenderness.     Comments: Minimal separation of abdominal muscles   Neurological:     Mental Status: She is alert and oriented to person, place, and time.     Labs and Imaging No results found for this or any previous visit (from the past 24 hour(s)).  No results found.   Assessment & Plan:   1. Encounter for annual GYN exam -Discussed benefits and risks of birth  control methods -Prescribed patient preferred combination pill  -Educated patient on the effects of postpartum including her abdominal wall weakness and hair loss  -Encouraged abdominal strengthening exercises  2. Bilateral knee pain -Encouraged patient to reestablish care with PCP  -Encouraged incorporating strengthening exercises to help with knee pain    Approximately 30 minutes of total time was spent with this patient on counseling and coordination of care.   Benson Setting, [place] 07/18/2020    10:32 AM   Attestation of Supervision of Student:  I confirm that I have verified the information documented in the physician assistant student's note and that I have also personally reperformed the history, physical exam and all medical decision making activities.  I have verified that all services and findings are accurately documented in this student's note; and I agree with management and plan as outlined in the documentation. I have also made any necessary editorial changes.  -Patient doing well, abdomen is soft, non-tender, no diastis rectis noted.  -Discussed birth control methods; patient wants to try OCP -DIscussed yoga, pilates and going to the gym for core strengthening -Patient requesting specific multi-vitamins, RX sent.   [place], [place] for [employer], [facility] 07/18/2020 3:56 PM

## 2020-10-22 ENCOUNTER — Ambulatory Visit: Payer: Medicaid Other | Admitting: Family Medicine

## 2020-11-15 ENCOUNTER — Encounter: Payer: Self-pay | Admitting: Family Medicine

## 2020-11-15 ENCOUNTER — Ambulatory Visit (INDEPENDENT_AMBULATORY_CARE_PROVIDER_SITE_OTHER): Payer: Medicaid Other | Admitting: Family Medicine

## 2020-11-15 ENCOUNTER — Other Ambulatory Visit: Payer: Self-pay

## 2020-11-15 VITALS — BP 120/72 | HR 67 | Wt 126.6 lb

## 2020-11-15 DIAGNOSIS — K429 Umbilical hernia without obstruction or gangrene: Secondary | ICD-10-CM

## 2020-11-15 DIAGNOSIS — L989 Disorder of the skin and subcutaneous tissue, unspecified: Secondary | ICD-10-CM

## 2020-11-15 DIAGNOSIS — Z0000 Encounter for general adult medical examination without abnormal findings: Secondary | ICD-10-CM

## 2020-11-15 DIAGNOSIS — Z8632 Personal history of gestational diabetes: Secondary | ICD-10-CM

## 2020-11-15 DIAGNOSIS — Z131 Encounter for screening for diabetes mellitus: Secondary | ICD-10-CM | POA: Diagnosis not present

## 2020-11-15 DIAGNOSIS — D508 Other iron deficiency anemias: Secondary | ICD-10-CM | POA: Diagnosis not present

## 2020-11-15 DIAGNOSIS — Z1159 Encounter for screening for other viral diseases: Secondary | ICD-10-CM | POA: Diagnosis not present

## 2020-11-15 LAB — POCT GLYCOSYLATED HEMOGLOBIN (HGB A1C): Hemoglobin A1C: 5.6 % (ref 4.0–5.6)

## 2020-11-15 NOTE — Progress Notes (Signed)
    SUBJECTIVE:   CHIEF COMPLAINT / HPI: new patient  PMH: patient diagnosed with GDM in last pregnancy in 2021 after failing 1 hr GTT. Patient reports that she forgot she had chocolate milk that day and 3hr GTT was subsequently WNL. LMP started today. Using OCPs for [facility]. PSH: n/a Family Hx: mother has DMT2 that has resolved with weight loss, patient's father's medical history is unknown because he left for [place] when she was a child and was never heard from again Social Hx: patient has 4 children- 13, 11, 9, and 10 months. Lives with husband and 4 children. She is a Occupational psychologist. She does not smoke, drink. Practices Islam.  BC: patient does not wish to become pregnant again. Currently using OCPs. Counseled on [facility] options. She is interested in paraguard IUD or vasectomy. Will discuss with her husband.   Pain in belly button: Patient reports that she has intermittent sharp pain in her belly button with bulging.   HC: patient has not yet gotten flu shot, COVID immunizations, or TDAP in more than 10 years. Counseled on getting the immunizations. Patient reports she will get them done at work. UTD on pap smear in 2021, NILM.  Scalp lesions: patient reports she has three nodules on the vertex of her scalp. She has been told they are lipomas. They have not grown or changes.  PERTINENT  PMH / PSH: none  OBJECTIVE:   BP 120/72   Pulse 67   Wt 126 lb 9.6 oz (57.4 kg)   SpO2 97%   BMI 21.73 kg/m   Nursing note and vitals reviewed GEN: age-appropriate, south Asian woman, resting comfortably in chair, NAD, WNWD HEENT: NCAT. Three 2-32mm nodules on vertex of scalp. Sclera without injection or icterus. MMM.  Neck: Supple.  Cardiac: Regular rate and rhythm. Normal S1/S2. No murmurs, rubs, or gallops appreciated. 2+ radial pulses. Lungs: Clear bilaterally to ascultation. No increased WOB, no accessory muscle usage. No w/r/r. Abdomen: Small umbilical hernia. Normoactive bowel sounds. No tenderness  to deep or light palpation. No rebound or guarding.   Neuro: Alert and at baseline Ext: no edema Psych: Pleasant and appropriate  ASSESSMENT/PLAN:   History of gestational diabetes Patient failed 1 hr GTT (reports she forgot she was not fasting), then passed 2 hr GTT. She has fam h/o DMT2 in mother. Rechecked A1c today, WNL at 5.4%. Patient does not have diabetes.  IDA (iron deficiency anemia) Checked CBC to assess improvement.   Lesion of skin of scalp Three 2-3 mm nodules on vertex scalp. No change in size. DDx includes sebaceous cyst vs lipoma. Continue to monitor, likely benign.  Umbilical hernia Small abdominal wall defect in umbilicus. Counseled on options including elective surgery or living with it. Given return to care precautions for obstruction, gangrene. Can give gen surgery referral if patient requests.  Health care maintenance UTD on pap smear, next due in 2026. Patient opts not to get mammogram at this time.  Patient to get flu, COVID vaccines, and TDAP at work Hep C ab checked.   Gladys Damme, MD [place]

## 2020-11-15 NOTE — Patient Instructions (Addendum)
It was a pleasure to see you today!  1. Your next pap smear is due in 2025.  2. For birth control, consider copper paraguard IUD, vasectomy (for your husband), or a bilateral tubal ligation (your tubes tied).  3. You do not have diabetes, your A1c is normal today at 5.6%.  4. I am checking your hemoglobin level today.  5. You have an umbilical hernia. If you would like this repaired, I can send you to a general surgeon. If you ever have severe pain and cannot massage your bowels back in through the hole in your belly button, you have nausea/vomiting, please go to the nearest emergency room.  Be Well,  Dr. Chauncey Reading   Umbilical Hernia, Adult  A hernia is a bulge of tissue that pushes through an opening between muscles. An umbilical hernia happens in the abdomen, near the belly button (umbilicus). The hernia may contain tissues from the small intestine, large intestine, or fatty tissue covering the intestines (omentum). Umbilical hernias in adults tend to get worse over time, and they require surgical treatment. There are several types of umbilical hernias. You may have:  A hernia located just above or below the umbilicus (indirect hernia). This is the most common type of umbilical hernia in adults.  A hernia that forms through an opening formed by the umbilicus (direct hernia).  A hernia that comes and goes (reducible hernia). A reducible hernia may be visible only when you strain, lift something heavy, or cough. This type of hernia can be pushed back into the abdomen (reduced).  A hernia that traps abdominal tissue inside the hernia (incarcerated hernia). This type of hernia cannot be reduced.  A hernia that cuts off blood flow to the tissues inside the hernia (strangulated hernia). The tissues can start to die if this happens. This type of hernia requires emergency treatment. What are the causes? An umbilical hernia happens when tissue inside the abdomen presses on a weak area of the  abdominal muscles. What increases the risk? You may have a greater risk of this condition if you:  Are obese.  Have had several pregnancies.  Have a buildup of fluid inside your abdomen (ascites).  Have had surgery that weakens the abdominal muscles. What are the signs or symptoms? The main symptom of this condition is a painless bulge at or near the belly button. A reducible hernia may be visible only when you strain, lift something heavy, or cough. Other symptoms may include:  Dull pain.  A feeling of pressure. Symptoms of a strangulated hernia may include:  Pain that gets increasingly worse.  Nausea and vomiting.  Pain when pressing on the hernia.  Skin over the hernia becoming red or purple.  Constipation.  Blood in the stool. How is this diagnosed? This condition may be diagnosed based on:  A physical exam. You may be asked to cough or strain while standing. These actions increase the pressure inside your abdomen and force the hernia through the opening in your muscles. Your health care provider may try to reduce the hernia by pressing on it.  Your symptoms and medical history. How is this treated? Surgery is the only treatment for an umbilical hernia. Surgery for a strangulated hernia is done as soon as possible. If you have a small hernia that is not incarcerated, you may need to lose weight before having surgery. Follow these instructions at home:  Lose weight, if told by your health care provider.  Do not try to push the  hernia back in.  Watch your hernia for any changes in color or size. Tell your health care provider if any changes occur.  You may need to avoid activities that increase pressure on your hernia.  Do not lift anything that is heavier than 10 lb (4.5 kg) until your health care provider says that this is safe.  Take over-the-counter and prescription medicines only as told by your health care provider.  Keep all follow-up visits as told by  your health care provider. This is important. Contact a health care provider if:  Your hernia gets larger.  Your hernia becomes painful. Get help right away if:  You develop sudden, severe pain near the area of your hernia.  You have pain as well as nausea or vomiting.  You have pain and the skin over your hernia changes color.  You develop a fever. This information is not intended to replace advice given to you by your health care provider. Make sure you discuss any questions you have with your health care provider. Document Revised: 10/27/2017 Document Reviewed: 03/15/2017 Elsevier Patient Education  [place].

## 2020-11-16 LAB — CBC WITH DIFFERENTIAL/PLATELET
Basophils Absolute: 0.1 10*3/uL (ref 0.0–0.2)
Basos: 1 %
EOS (ABSOLUTE): 0.2 10*3/uL (ref 0.0–0.4)
Eos: 3 %
Hematocrit: 36.4 % (ref 34.0–46.6)
Hemoglobin: 11.8 g/dL (ref 11.1–15.9)
Immature Grans (Abs): 0 10*3/uL (ref 0.0–0.1)
Immature Granulocytes: 0 %
Lymphocytes Absolute: 1.9 10*3/uL (ref 0.7–3.1)
Lymphs: 30 %
MCH: 26.3 pg — ABNORMAL LOW (ref 26.6–33.0)
MCHC: 32.4 g/dL (ref 31.5–35.7)
MCV: 81 fL (ref 79–97)
Monocytes Absolute: 0.5 10*3/uL (ref 0.1–0.9)
Monocytes: 7 %
Neutrophils Absolute: 3.7 10*3/uL (ref 1.4–7.0)
Neutrophils: 59 %
Platelets: 337 10*3/uL (ref 150–450)
RBC: 4.48 x10E6/uL (ref 3.77–5.28)
RDW: 13.9 % (ref 11.7–15.4)
WBC: 6.2 10*3/uL (ref 3.4–10.8)

## 2020-11-16 LAB — HEPATITIS C ANTIBODY: Hep C Virus Ab: <0.1 s/co ratio (ref 0.0–0.9)

## 2020-11-17 DIAGNOSIS — K429 Umbilical hernia without obstruction or gangrene: Secondary | ICD-10-CM | POA: Insufficient documentation

## 2020-11-17 NOTE — Assessment & Plan Note (Signed)
Checked CBC to assess improvement.

## 2020-11-17 NOTE — Assessment & Plan Note (Signed)
Patient failed 1 hr GTT (reports she forgot she was not fasting), then passed 2 hr GTT. She has fam h/o DMT2 in mother. Rechecked A1c today, WNL at 5.4%. Patient does not have diabetes.

## 2020-11-17 NOTE — Assessment & Plan Note (Addendum)
UTD on pap smear, next due in 2026. Patient opts not to get mammogram at this time.  Patient to get flu, COVID vaccines, and TDAP at work Hep C ab checked.

## 2020-11-17 NOTE — Assessment & Plan Note (Signed)
Three 2-3 mm nodules on vertex scalp. No change in size. DDx includes sebaceous cyst vs lipoma. Continue to monitor, likely benign.

## 2020-11-17 NOTE — Assessment & Plan Note (Signed)
Small abdominal wall defect in umbilicus. Counseled on options including elective surgery or living with it. Given return to care precautions for obstruction, gangrene. Can give gen surgery referral if patient requests.

## 2020-11-18 ENCOUNTER — Encounter: Payer: Self-pay | Admitting: Family Medicine

## 2020-11-27 ENCOUNTER — Telehealth: Payer: Self-pay

## 2020-11-27 DIAGNOSIS — K429 Umbilical hernia without obstruction or gangrene: Secondary | ICD-10-CM

## 2020-11-27 NOTE — Telephone Encounter (Signed)
Patient calls nurse line requesting for PCP to place referral for hernia repair. Patient reports he insurance expires at the end of March and would like this to be done soon. Advised patient we can only move as quickly as the system allows. However, I will see what can be done. Will forward to PCP.

## 2020-11-27 NOTE — Telephone Encounter (Signed)
Referral to gen surg placed.  Gladys Damme, MD [facility], PGY-2

## 2021-01-02 DIAGNOSIS — K420 Umbilical hernia with obstruction, without gangrene: Secondary | ICD-10-CM | POA: Diagnosis not present

## 2021-03-12 ENCOUNTER — Ambulatory Visit: Payer: Self-pay | Admitting: Surgery

## 2021-03-12 NOTE — Pre-Procedure Instructions (Signed)
Surgical Instructions    Your procedure is scheduled on Friday June 24th.   Report to [facility] Main Entrance "A" at 10:30 A.M., then check in with the Admitting office.  Call this number if you have problems the morning of surgery:  [phone]   If you have any questions prior to your surgery date call [phone]: Open Monday-Friday 8am-4pm    Remember:  Do not eat or drink after midnight the night before your surgery     Take these medicines the morning of surgery with A SIP OF WATER None     As of today, STOP taking any Aspirin (unless otherwise instructed by your surgeon) Aleve, Naproxen, Ibuprofen, Motrin, Advil, Goody's, BC's, all herbal medications, fish oil, and all vitamins.          Do not wear jewelry or makeup Do not wear lotions, powders, perfumes, or deodorant. Do not shave 48 hours prior to surgery.   Do not bring valuables to the hospital. DO Not wear nail polish, gel polish, artificial nails, or any other type of covering on natural nails including finger and toenails. If patients have artificial nails, gel coating, etc. that need to be removed by a nail salon please have this removed prior to surgery or surgery may need to be canceled/delayed if the surgeon/ anesthesia feels like the patient is unable to be adequately monitored.             [facility] is not responsible for any belongings or valuables.  Do NOT Smoke (Tobacco/Vaping) or drink Alcohol 24 hours prior to your procedure If you use a CPAP at night, you may bring all equipment for your overnight stay.   Contacts, glasses, dentures or partials may not be worn into surgery, please bring cases for these belongings   For patients admitted to the hospital, discharge time will be determined by your treatment team.   Patients discharged the day of surgery will not be allowed to drive home, and someone needs to stay with them for 24 hours.  ONLY 1 SUPPORT PERSON MAY BE PRESENT WHILE YOU ARE IN  SURGERY. IF YOU ARE TO BE ADMITTED ONCE YOU ARE IN YOUR ROOM YOU WILL BE ALLOWED TWO (2) VISITORS.  Minor children may have two parents present. Special consideration for safety and communication needs will be reviewed on a case by case basis.  Special instructions:    Oral Hygiene is also important to reduce your risk of infection.  Remember - BRUSH YOUR TEETH THE MORNING OF SURGERY WITH YOUR REGULAR TOOTHPASTE   [place]- Preparing For Surgery  Before surgery, you can play an important role. Because skin is not sterile, your skin needs to be as free of germs as possible. You can reduce the number of germs on your skin by washing with CHG (chlorahexidine gluconate) Soap before surgery.  CHG is an antiseptic cleaner which kills germs and bonds with the skin to continue killing germs even after washing.     Please do not use if you have an allergy to CHG or antibacterial soaps. If your skin becomes reddened/irritated stop using the CHG.  Do not shave (including legs and underarms) for at least 48 hours prior to first CHG shower. It is OK to shave your face.  Please follow these instructions carefully.     Shower the NIGHT BEFORE SURGERY and the MORNING OF SURGERY with CHG Soap.   If you chose to wash your hair, wash your hair first as usual with  your normal shampoo. After you shampoo, rinse your hair and body thoroughly to remove the shampoo.  Then [employer] and genitals (private parts) with your normal soap and rinse thoroughly to remove soap.  After that Use CHG Soap as you would any other liquid soap. You can apply CHG directly to the skin and wash gently with a scrungie or a clean washcloth.   Apply the CHG Soap to your body ONLY FROM THE NECK DOWN.  Do not use on open wounds or open sores. Avoid contact with your eyes, ears, mouth and genitals (private parts). Wash Face and genitals (private parts)  with your normal soap.   Wash thoroughly, paying special attention to the area where  your surgery will be performed.  Thoroughly rinse your body with warm water from the neck down.  DO NOT shower/wash with your normal soap after using and rinsing off the CHG Soap.  Pat yourself dry with a CLEAN TOWEL.  Wear CLEAN PAJAMAS to bed the night before surgery  Place CLEAN SHEETS on your bed the night before your surgery  DO NOT SLEEP WITH PETS.   Day of Surgery:  Take a shower with CHG soap. Wear Clean/Comfortable clothing the morning of surgery Do not apply any deodorants/lotions.   Remember to brush your teeth WITH YOUR REGULAR TOOTHPASTE.   Please read over the following fact sheets that you were given.

## 2021-03-13 ENCOUNTER — Other Ambulatory Visit: Payer: Self-pay

## 2021-03-13 ENCOUNTER — Encounter (HOSPITAL_COMMUNITY)
Admission: RE | Admit: 2021-03-13 | Discharge: 2021-03-13 | Disposition: A | Discharge status: Home / Self Care | Payer: Medicaid Other | Source: Ambulatory Visit | Attending: Surgery | Admitting: Surgery

## 2021-03-13 ENCOUNTER — Encounter (HOSPITAL_COMMUNITY): Payer: Self-pay

## 2021-03-13 DIAGNOSIS — Z01812 Encounter for preprocedural laboratory examination: Secondary | ICD-10-CM | POA: Diagnosis present

## 2021-03-13 LAB — CBC
HCT: 37.9 % (ref 36.0–46.0)
Hemoglobin: 11.8 g/dL — ABNORMAL LOW (ref 12.0–15.0)
MCH: 26.3 pg (ref 26.0–34.0)
MCHC: 31.1 g/dL (ref 30.0–36.0)
MCV: 84.6 fL (ref 80.0–100.0)
Platelets: 330 10*3/uL (ref 150–400)
RBC: 4.48 MIL/uL (ref 3.87–5.11)
RDW: 15.3 % (ref 11.5–15.5)
WBC: 7.6 10*3/uL (ref 4.0–10.5)
nRBC: 0 % (ref 0.0–0.2)

## 2021-03-13 NOTE — Progress Notes (Signed)
PCP - Gladys Damme Cardiologist - denies  PPM/ICD - denies   Chest x-ray - n/a EKG - n/a Stress Test -denies  ECHO - denies Cardiac Cath - denies  Sleep Study - denies   No diabetes  Patient instructed to hold all Aspirin, NSAID's, herbal medications, fish oil and vitamins 7 days prior to surgery.   ERAS Protcol -no   COVID TEST- ambulatory surgery- not needed   Anesthesia review: no  Patient denies shortness of breath, fever, cough and chest pain at PAT appointment   All instructions explained to the patient, with a verbal understanding of the material. Patient agrees to go over the instructions while at home for a better understanding. Patient also instructed to self quarantine after being tested for COVID-19. The opportunity to ask questions was provided.

## 2021-03-18 ENCOUNTER — Other Ambulatory Visit (HOSPITAL_COMMUNITY): Payer: Medicaid Other

## 2021-03-21 ENCOUNTER — Ambulatory Visit (HOSPITAL_COMMUNITY): Payer: Medicaid Other | Admitting: Certified Registered Nurse Anesthetist

## 2021-03-21 ENCOUNTER — Encounter (HOSPITAL_COMMUNITY)
Admission: RE | Disposition: A | Discharge status: Home / Self Care | Payer: Self-pay | Source: Home / Self Care | Attending: Surgery

## 2021-03-21 ENCOUNTER — Ambulatory Visit (HOSPITAL_COMMUNITY)
Admission: RE | Admit: 2021-03-21 | Discharge: 2021-03-21 | Disposition: A | Discharge status: Home / Self Care | Payer: Medicaid Other | Attending: Surgery | Admitting: Surgery

## 2021-03-21 ENCOUNTER — Encounter (HOSPITAL_COMMUNITY): Payer: Self-pay | Admitting: Surgery

## 2021-03-21 ENCOUNTER — Other Ambulatory Visit: Payer: Self-pay

## 2021-03-21 DIAGNOSIS — K420 Umbilical hernia with obstruction, without gangrene: Secondary | ICD-10-CM | POA: Insufficient documentation

## 2021-03-21 DIAGNOSIS — Z880 Allergy status to penicillin: Secondary | ICD-10-CM | POA: Diagnosis not present

## 2021-03-21 DIAGNOSIS — L989 Disorder of the skin and subcutaneous tissue, unspecified: Secondary | ICD-10-CM | POA: Diagnosis not present

## 2021-03-21 DIAGNOSIS — Z888 Allergy status to other drugs, medicaments and biological substances status: Secondary | ICD-10-CM | POA: Diagnosis not present

## 2021-03-21 DIAGNOSIS — K429 Umbilical hernia without obstruction or gangrene: Secondary | ICD-10-CM | POA: Diagnosis not present

## 2021-03-21 DIAGNOSIS — D509 Iron deficiency anemia, unspecified: Secondary | ICD-10-CM | POA: Diagnosis not present

## 2021-03-21 HISTORY — PX: UMBILICAL HERNIA REPAIR: SHX196

## 2021-03-21 LAB — POCT PREGNANCY, URINE: Preg Test, Ur: NEGATIVE

## 2021-03-21 SURGERY — REPAIR, HERNIA, UMBILICAL, ADULT
Anesthesia: General | Site: Abdomen

## 2021-03-21 MED ORDER — CHLORHEXIDINE GLUCONATE CLOTH 2 % EX PADS: 6.0000 | MEDICATED_PAD | Freq: Once | CUTANEOUS | Status: DC

## 2021-03-21 MED ORDER — ROCURONIUM BROMIDE 10 MG/ML (PF) SYRINGE
PREFILLED_SYRINGE | INTRAVENOUS | Status: DC | PRN
  Administered 2021-03-21: 60 mg via INTRAVENOUS

## 2021-03-21 MED ORDER — BUPIVACAINE-EPINEPHRINE 0.25% -1:200000 IJ SOLN
INTRAMUSCULAR | Status: DC | PRN
  Administered 2021-03-21: 30 mL

## 2021-03-21 MED ORDER — PROPOFOL 10 MG/ML IV BOLUS
INTRAVENOUS | Status: AC
  Filled 2021-03-21: qty 20

## 2021-03-21 MED ORDER — 0.9 % SODIUM CHLORIDE (POUR BTL) OPTIME
TOPICAL | Status: DC | PRN
  Administered 2021-03-21: 1000 mL

## 2021-03-21 MED ORDER — DOCUSATE SODIUM 100 MG PO CAPS: 100.0000 mg | ORAL_CAPSULE | Freq: Two times a day (BID) | ORAL | 2 refills | Status: AC

## 2021-03-21 MED ORDER — CEFAZOLIN SODIUM-DEXTROSE 2-4 GM/100ML-% IV SOLN
INTRAVENOUS | Status: AC
  Filled 2021-03-21: qty 100

## 2021-03-21 MED ORDER — ONDANSETRON HCL 4 MG/2ML IJ SOLN
INTRAMUSCULAR | Status: DC | PRN
  Administered 2021-03-21: 4 mg via INTRAVENOUS

## 2021-03-21 MED ORDER — ORAL CARE MOUTH RINSE: 15.0000 mL | Freq: Once | OROMUCOSAL | Status: AC

## 2021-03-21 MED ORDER — BUPIVACAINE LIPOSOME 1.3 % IJ SUSP
INTRAMUSCULAR | Status: AC
  Filled 2021-03-21: qty 20

## 2021-03-21 MED ORDER — DIPHENHYDRAMINE HCL 50 MG/ML IJ SOLN
INTRAMUSCULAR | Status: AC
  Filled 2021-03-21: qty 1

## 2021-03-21 MED ORDER — BUPIVACAINE LIPOSOME 1.3 % IJ SUSP
INTRAMUSCULAR | Status: DC | PRN
  Administered 2021-03-21: 20 mL

## 2021-03-21 MED ORDER — PROPOFOL 10 MG/ML IV BOLUS
INTRAVENOUS | Status: DC | PRN
  Administered 2021-03-21: 120 mg via INTRAVENOUS

## 2021-03-21 MED ORDER — DEXAMETHASONE SODIUM PHOSPHATE 10 MG/ML IJ SOLN
INTRAMUSCULAR | Status: AC
  Filled 2021-03-21: qty 1

## 2021-03-21 MED ORDER — CEFAZOLIN SODIUM-DEXTROSE 2-4 GM/100ML-% IV SOLN
2.0000 g | INTRAVENOUS | Status: AC
  Administered 2021-03-21: 2 g via INTRAVENOUS

## 2021-03-21 MED ORDER — DEXAMETHASONE SODIUM PHOSPHATE 10 MG/ML IJ SOLN
INTRAMUSCULAR | Status: DC | PRN
  Administered 2021-03-21: 10 mg via INTRAVENOUS

## 2021-03-21 MED ORDER — OXYCODONE HCL 5 MG PO TABS: 5.0000 mg | ORAL_TABLET | Freq: Once | ORAL | Status: DC | PRN

## 2021-03-21 MED ORDER — METHOCARBAMOL 750 MG PO TABS: 750.0000 mg | ORAL_TABLET | Freq: Four times a day (QID) | ORAL | 1 refills | Status: DC

## 2021-03-21 MED ORDER — MIDAZOLAM HCL 2 MG/2ML IJ SOLN
INTRAMUSCULAR | Status: AC
  Filled 2021-03-21: qty 2

## 2021-03-21 MED ORDER — MIDAZOLAM HCL 2 MG/2ML IJ SOLN
INTRAMUSCULAR | Status: DC | PRN
  Administered 2021-03-21: 2 mg via INTRAVENOUS

## 2021-03-21 MED ORDER — PROMETHAZINE HCL 25 MG/ML IJ SOLN
6.2500 mg | INTRAMUSCULAR | Status: DC | PRN
Start: 2021-03-21 — End: 2021-03-21

## 2021-03-21 MED ORDER — OXYCODONE HCL 5 MG PO TABS: 5.0000 mg | ORAL_TABLET | ORAL | 0 refills | Status: DC | PRN

## 2021-03-21 MED ORDER — PHENYLEPHRINE 40 MCG/ML (10ML) SYRINGE FOR IV PUSH (FOR BLOOD PRESSURE SUPPORT)
PREFILLED_SYRINGE | INTRAVENOUS | Status: AC
  Filled 2021-03-21: qty 10

## 2021-03-21 MED ORDER — DIPHENHYDRAMINE HCL 50 MG/ML IJ SOLN: 6.2500 mg | Freq: Once | INTRAMUSCULAR | Status: DC

## 2021-03-21 MED ORDER — LACTATED RINGERS IV SOLN: INTRAVENOUS | Status: DC

## 2021-03-21 MED ORDER — BUPIVACAINE-EPINEPHRINE (PF) 0.25% -1:200000 IJ SOLN
INTRAMUSCULAR | Status: AC
  Filled 2021-03-21: qty 30

## 2021-03-21 MED ORDER — MEPERIDINE HCL 25 MG/ML IJ SOLN
6.2500 mg | INTRAMUSCULAR | Status: DC | PRN
Start: 2021-03-21 — End: 2021-03-21

## 2021-03-21 MED ORDER — CHLORHEXIDINE GLUCONATE 0.12 % MT SOLN
OROMUCOSAL | Status: AC
  Administered 2021-03-21: 15 mL via OROMUCOSAL
  Filled 2021-03-21: qty 15

## 2021-03-21 MED ORDER — FENTANYL CITRATE (PF) 250 MCG/5ML IJ SOLN
INTRAMUSCULAR | Status: DC | PRN
  Administered 2021-03-21 (×2): 50 ug via INTRAVENOUS

## 2021-03-21 MED ORDER — SUGAMMADEX SODIUM 200 MG/2ML IV SOLN
INTRAVENOUS | Status: DC | PRN
  Administered 2021-03-21: 200 mg via INTRAVENOUS

## 2021-03-21 MED ORDER — IBUPROFEN 600 MG PO TABS: 600.0000 mg | ORAL_TABLET | Freq: Four times a day (QID) | ORAL | 1 refills | Status: AC | PRN

## 2021-03-21 MED ORDER — OXYCODONE HCL 5 MG/5ML PO SOLN: 5.0000 mg | Freq: Once | ORAL | Status: DC | PRN

## 2021-03-21 MED ORDER — HYDROMORPHONE HCL 1 MG/ML IJ SOLN: 0.2500 mg | INTRAMUSCULAR | Status: DC | PRN

## 2021-03-21 MED ORDER — AMISULPRIDE (ANTIEMETIC) 5 MG/2ML IV SOLN: 10.0000 mg | Freq: Once | INTRAVENOUS | Status: DC | PRN

## 2021-03-21 MED ORDER — FENTANYL CITRATE (PF) 250 MCG/5ML IJ SOLN
INTRAMUSCULAR | Status: AC
  Filled 2021-03-21: qty 5

## 2021-03-21 MED ORDER — CHLORHEXIDINE GLUCONATE 0.12 % MT SOLN: 15.0000 mL | Freq: Once | OROMUCOSAL | Status: AC

## 2021-03-21 MED ORDER — ONDANSETRON HCL 4 MG/2ML IJ SOLN
INTRAMUSCULAR | Status: AC
  Filled 2021-03-21: qty 2

## 2021-03-21 SURGICAL SUPPLY — 31 items
BINDER ABDOMINAL 12 ML 46-62 (SOFTGOODS) ×2 IMPLANT
BLADE CLIPPER SURG (BLADE) IMPLANT
CANISTER SUCT 3000ML PPV (MISCELLANEOUS) IMPLANT
CHLORAPREP W/TINT 26 (MISCELLANEOUS) ×2 IMPLANT
COVER SURGICAL LIGHT HANDLE (MISCELLANEOUS) ×2 IMPLANT
DERMABOND ADVANCED (GAUZE/BANDAGES/DRESSINGS) ×1
DERMABOND ADVANCED .7 DNX12 (GAUZE/BANDAGES/DRESSINGS) ×1 IMPLANT
DRAPE LAPAROSCOPIC ABDOMINAL (DRAPES) ×2 IMPLANT
ELECT CAUTERY BLADE 6.4 (BLADE) IMPLANT
ELECT REM PT RETURN 9FT ADLT (ELECTROSURGICAL) ×2
ELECTRODE REM PT RTRN 9FT ADLT (ELECTROSURGICAL) ×1 IMPLANT
GAUZE 4X4 16PLY RFD (DISPOSABLE) ×2 IMPLANT
GLOVE SURG ENC MOIS LTX SZ6.5 (GLOVE) ×2 IMPLANT
GLOVE SURG UNDER POLY LF SZ6 (GLOVE) ×2 IMPLANT
GOWN STRL REUS W/ TWL LRG LVL3 (GOWN DISPOSABLE) ×2 IMPLANT
GOWN STRL REUS W/TWL LRG LVL3 (GOWN DISPOSABLE) ×2
KIT BASIN OR (CUSTOM PROCEDURE TRAY) ×2 IMPLANT
KIT TURNOVER KIT B (KITS) ×2 IMPLANT
NEEDLE HYPO 25GX1X1/2 BEV (NEEDLE) ×2 IMPLANT
NS IRRIG 1000ML POUR BTL (IV SOLUTION) ×2 IMPLANT
PACK GENERAL/GYN (CUSTOM PROCEDURE TRAY) ×2 IMPLANT
PAD ARMBOARD 7.5X6 YLW CONV (MISCELLANEOUS) ×2 IMPLANT
PENCIL SMOKE EVACUATOR (MISCELLANEOUS) ×2 IMPLANT
SUT MNCRL AB 4-0 PS2 18 (SUTURE) ×2 IMPLANT
SUT NOVA NAB DX-16 0-1 5-0 T12 (SUTURE) ×2 IMPLANT
SUT NOVA NAB GS-21 0 18 T12 DT (SUTURE) ×2 IMPLANT
SUT VIC AB 3-0 SH 27 (SUTURE) ×1
SUT VIC AB 3-0 SH 27X BRD (SUTURE) ×1 IMPLANT
SYR CONTROL 10ML LL (SYRINGE) ×2 IMPLANT
TOWEL GREEN STERILE (TOWEL DISPOSABLE) ×2 IMPLANT
TOWEL GREEN STERILE FF (TOWEL DISPOSABLE) ×2 IMPLANT

## 2021-03-21 NOTE — Anesthesia Procedure Notes (Signed)
Procedure Name: Intubation Date/Time: 03/21/2021 11:47 AM Performed by: Lance Coon, CRNA Pre-anesthesia Checklist: Patient identified, Emergency Drugs available, Suction available, Patient being monitored and Timeout performed Patient Re-evaluated:Patient Re-evaluated prior to induction Oxygen Delivery Method: Circle system utilized Preoxygenation: Pre-oxygenation with 100% oxygen Induction Type: IV induction Ventilation: Mask ventilation without difficulty Laryngoscope Size: Miller and 3 Grade View: Grade I Tube type: Oral Tube size: 7.0 mm Number of attempts: 1 Airway Equipment and Method: Stylet Placement Confirmation: ETT inserted through vocal cords under direct vision, positive ETCO2 and breath sounds checked- equal and bilateral Secured at: 21 cm Tube secured with: Tape Dental Injury: Teeth and Oropharynx as per pre-operative assessment

## 2021-03-21 NOTE — Transfer of Care (Signed)
Immediate Anesthesia Transfer of Care Note  Patient: Jessica Ballard  Procedure(s) Performed: UMBILICAL HERNIA REPAIR (Abdomen)  Patient Location: PACU  Anesthesia Type:General  Level of Consciousness: drowsy and patient cooperative  Airway & Oxygen Therapy: Patient Spontanous Breathing and Patient connected to nasal cannula oxygen  Post-op Assessment: Report given to RN and Post -op Vital signs reviewed and stable  Post vital signs: Reviewed and stable  Last Vitals:  Vitals Value Taken Time  BP 98/72 03/21/21 1226  Temp    Pulse 97 03/21/21 1228  Resp 11 03/21/21 1228  SpO2 100 % 03/21/21 1228  Vitals shown include unvalidated device data.  Last Pain:  Vitals:   03/21/21 1032  TempSrc:   PainSc: 0-No pain      Patients Stated Pain Goal: 0 (50/56/97 9480)  Complications: No notable events documented.

## 2021-03-21 NOTE — Progress Notes (Signed)
MD made aware of patient itching new order received for bendaryl

## 2021-03-21 NOTE — Discharge Instructions (Addendum)
May shower beginning 03/22/2021. Do not peel off or scrub skin glue. May allow warm soapy water to run over incision, then rinse and pat dry. Do not soak in any water (tubs, hot tubs, pools, lakes, oceans) for one week.   No lifting greater than 5 pounds for six weeks.   Pain regimen: take over-the-counter tylenol (acetaminophen) 1000mg  every six hours, the prescription ibuprofen (600mg ) every six hours and the robaxin (methocarbamol) 750mg  every six hours. With all three of these, you should be taking something every two hours. Example: tylenol ( acetaminophen) at 8am, ibuprofen at 10am, robaxin (methocarbamol) at 12pm, tylenol (acetaminophen) again at 2pm, ibuprofen again at 4pm, robaxin (methocarbamol) at 6pm. You also have a prescription for oxycodone, which should be taken if the tylenol (acetaminophen), ibuprofen, and robaxin (methocarbamol) are not enough to control your pain. You may take the oxycodone as frequently as every four hours as needed, but if you are taking the other medications as above, you should not need the oxycodone this frequently. You have also been given a prescription for colace (docusate) which is a stool softener. Please take this as prescribed because the oxycodone can cause constipation and the colace (docusate) will minimize or prevent constipation.  Call the office at [phone] for temperature greater than 101.59F, worsening pain, redness or warmth at the incision site.  Please call [phone] to make an appointment for 2 weeks after surgery for wound check.

## 2021-03-21 NOTE — Op Note (Signed)
   Operative Note   Date: 03/21/2021  Procedure: open primary umbilical hernia repair  Pre-op diagnosis: incarcerated umbilical hernia Post-op diagnosis: fat-containing incarcerated umbilical hernia  Indication and clinical history: The patient is a 40 y.o. year old female with a symptomatic umbilical hernia     Surgeon: Jesusita Oka, MD  Anesthesiologist: Sabra Heck, MD Anesthesia: General  Findings:  Specimen: none EBL: <5cc Drains/Implants: none  Disposition: PACU - hemodynamically stable.  Description of procedure: The patient was positioned supine on the operating room table. General anesthetic induction and intubation were uneventful. Time-out was performed verifying correct patient, procedure, signature of informed consent, and administration of pre-operative antibiotics. The abdomen was prepped and draped in the usual sterile fashion.  A midline incision was made and the incarcerated hernia contents were visualized and transected. The edges of the subcentimeter hernia were cleared and a single figure-of-eight novofil suture used to re-approximate the fascia. The skin was re-approximated with 4-0 monocryl suture.   Sterile dressings were applied. All sponge and instrument counts were correct at the conclusion of the procedure. The patient was awakened from anesthesia, extubated uneventfully, and transported to the PACU in good condition. There were no complications.     Jesusita Oka, MD General and [facility]

## 2021-03-21 NOTE — H&P (Addendum)
   Jessica Ballard is an 40 y.o. female.   HPI: 55D with umbilical hernia presents for repair. The patient has had no hospitalizations, doctors visits, ER visits, surgeries, or newly diagnosed allergies since being seen in the office.    Past Medical History:  Diagnosis Date   Anemia    Contraception 04/03/2016   Loss of weight 03/14/2013   Normal delivery 09/04/2011   Underweight 03/17/2013    Past Surgical History:  Procedure Laterality Date   NO PAST SURGERIES      Family History  Problem Relation Age of Onset   Diabetes Mother    Kidney disease Mother    Diabetes Maternal Aunt    Diabetes Maternal Uncle    Diabetes Maternal Grandmother    Diabetes Maternal Grandfather     Social History:  reports that she has never smoked. She has never used smokeless tobacco. She reports that she does not drink alcohol and does not use drugs.  Allergies:  Allergies  Allergen Reactions   Penicillins Hives   Zolpidem Tartrate Other (See Comments)    Hallucinations/bizarre behavior    Medications: I have reviewed the patient's current medications.  Results for orders placed or performed during the hospital encounter of 03/21/21 (from the past 48 hour(s))  Pregnancy, urine POC     Status: None   Collection Time: 03/21/21 10:28 AM  Result Value Ref Range   Preg Test, Ur NEGATIVE NEGATIVE    Comment:        THE SENSITIVITY OF THIS METHODOLOGY IS >24 mIU/mL     No results found.  ROS 10 point review of systems is negative except as listed above in HPI.   Physical Exam Blood pressure 107/62, pulse 65, temperature 98 F (36.7 C), temperature source Oral, resp. rate 17, height 5\' 3"  (1.6 m), weight 56.7 kg, last menstrual period 03/13/2021, SpO2 100 %, currently breastfeeding. Constitutional: well-developed, well-nourished HEENT: pupils equal, round, reactive to light, 30mm b/l, moist conjunctiva, external inspection of ears and nose normal, hearing intact Oropharynx: normal  oropharyngeal mucosa, normal dentition Neck: no thyromegaly, trachea midline, no midline cervical tenderness to palpation Chest: breath sounds equal bilaterally, normal respiratory effort, no midline or lateral chest wall tenderness to palpation/deformity Abdomen: soft, NT, small fat-containing umbilical hernia, no bruising, no hepatosplenomegaly GU: normal female genitalia  Back: no wounds, no thoracic/lumbar spine tenderness to palpation, no thoracic/lumbar spine stepoffs Rectal: deferred Extremities: 2+ radial and pedal pulses bilaterally, motor and sensation intact to bilateral UE and LE, no peripheral edema MSK: normal gait/station, no clubbing/cyanosis of fingers/toes, normal ROM of all four extremities Skin: warm, dry, no rashes Psych: normal memory, normal mood/affect    Assessment/Plan: 31K with umbilical hernia presents for repair. Plan for primary repair. Informed consent was obtained after detailed explanation of risks, including bleeding, infection, hematoma/seroma, hernia recurrence. All questions answered to the patient's satisfaction.   Jesusita Oka, MD General and [facility]

## 2021-03-21 NOTE — Anesthesia Postprocedure Evaluation (Signed)
Anesthesia Post Note  Patient: Jessica Ballard  Procedure(s) Performed: UMBILICAL HERNIA REPAIR (Abdomen)     Patient location during evaluation: PACU Anesthesia Type: General Level of consciousness: awake and alert Pain management: pain level controlled Vital Signs Assessment: post-procedure vital signs reviewed and stable Respiratory status: spontaneous breathing, nonlabored ventilation and respiratory function stable Cardiovascular status: blood pressure returned to baseline and stable Postop Assessment: no apparent nausea or vomiting Anesthetic complications: no   No notable events documented.  Last Vitals:  Vitals:   03/21/21 1241 03/21/21 1256  BP: 106/72 96/67  Pulse: 91 71  Resp: 12 10  Temp:  (!) 36.4 C  SpO2: 100% 95%    Last Pain:  Vitals:   03/21/21 1032  TempSrc:   PainSc: 0-No pain                 Lynda Rainwater

## 2021-03-21 NOTE — Anesthesia Preprocedure Evaluation (Signed)
Anesthesia Evaluation  Patient identified by MRN, date of birth, ID band Patient awake    Reviewed: Allergy & Precautions, NPO status , Patient's Chart, lab work & pertinent test results  Airway Mallampati: II  TM Distance: >3 FB Neck ROM: Full    Dental no notable dental hx.    Pulmonary neg pulmonary ROS,    Pulmonary exam normal breath sounds clear to auscultation       Cardiovascular negative cardio ROS Normal cardiovascular exam Rhythm:Regular Rate:Normal     Neuro/Psych negative neurological ROS  negative psych ROS   GI/Hepatic negative GI ROS, Neg liver ROS,   Endo/Other  negative endocrine ROS  Renal/GU negative Renal ROS  negative genitourinary   Musculoskeletal negative musculoskeletal ROS (+)   Abdominal   Peds negative pediatric ROS (+)  Hematology negative hematology ROS (+)   Anesthesia Other Findings   Reproductive/Obstetrics negative OB ROS                             Anesthesia Physical Anesthesia Plan  ASA: 1  Anesthesia Plan: General   Post-op Pain Management:    Induction: Intravenous  PONV Risk Score and Plan: 3 and Ondansetron, Dexamethasone, Midazolam and Treatment may vary due to age or medical condition  Airway Management Planned: Oral ETT  Additional Equipment:   Intra-op Plan:   Post-operative Plan: Extubation in OR  Informed Consent: I have reviewed the patients History and Physical, chart, labs and discussed the procedure including the risks, benefits and alternatives for the proposed anesthesia with the patient or authorized representative who has indicated his/her understanding and acceptance.     Dental advisory given  Plan Discussed with: CRNA  Anesthesia Plan Comments:         Anesthesia Quick Evaluation

## 2021-03-21 NOTE — Progress Notes (Signed)
Verified with Dr Bobbye Morton pt's PCN allergy. Ok to give Ancef.

## 2021-03-22 ENCOUNTER — Encounter (HOSPITAL_COMMUNITY): Payer: Self-pay | Admitting: Surgery

## 2021-05-16 IMAGING — US US MFM OB DETAIL+14 WK
1 series · 13 of 28 positions shown · non-contrast
Comparison: none

[Series 1: us mfm ob detail+14 wk · 76 acquisitions, 13 frames shown]
[im 3/76]
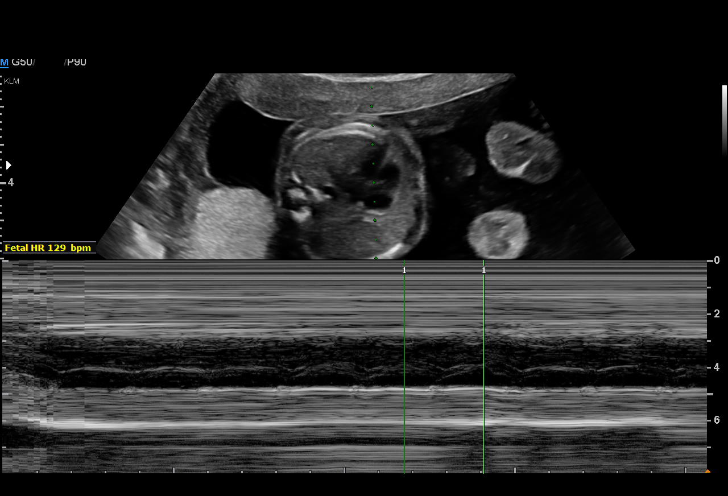
[im 9/76]
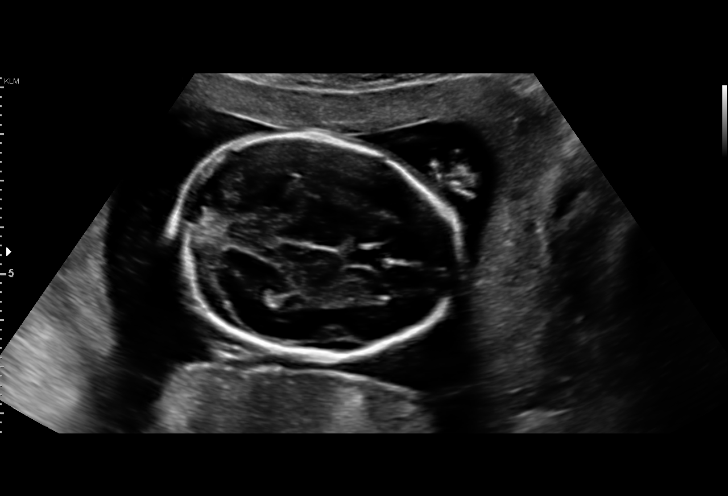
[im 14/76]
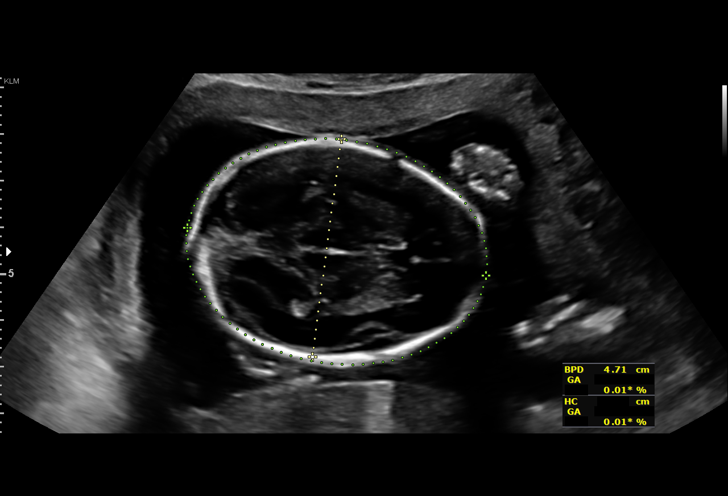
[im 20/76]
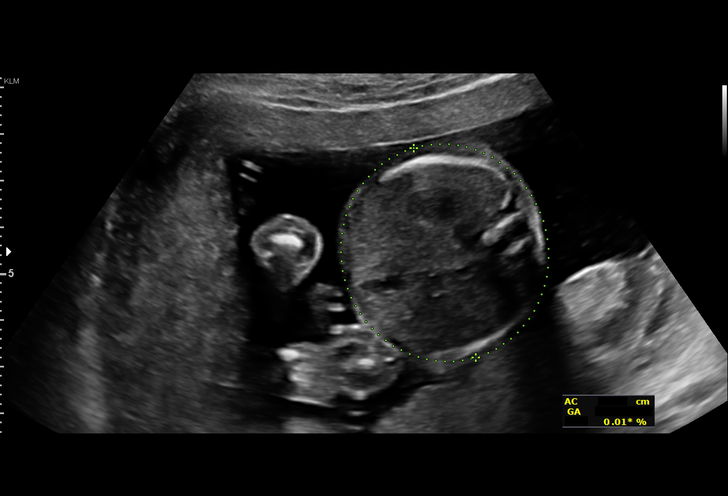
[im 26/76]
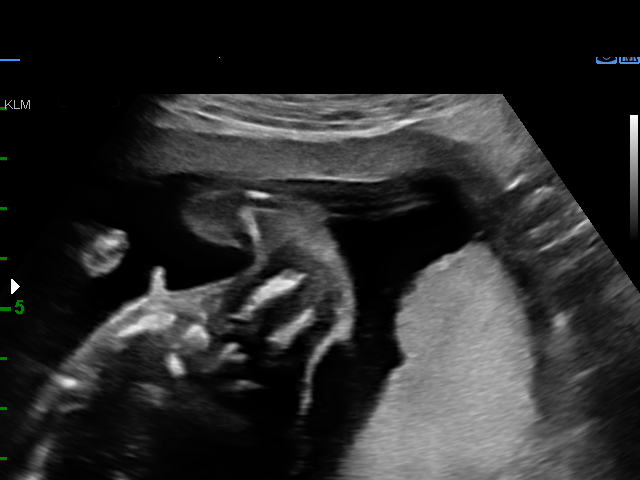
[im 31/76]
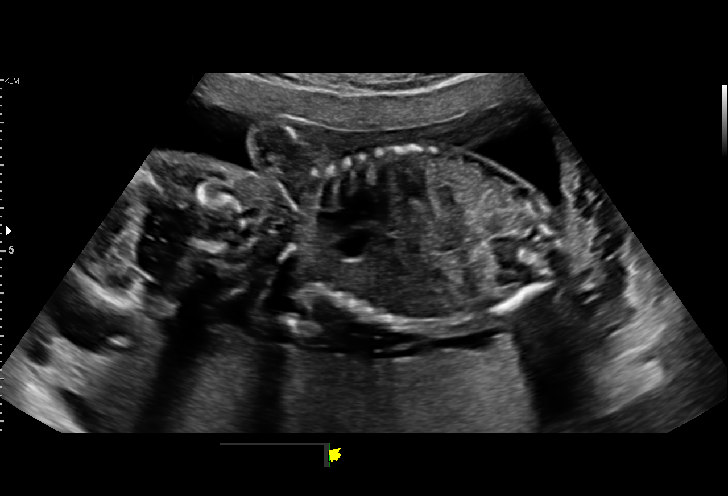
[im 39/76]
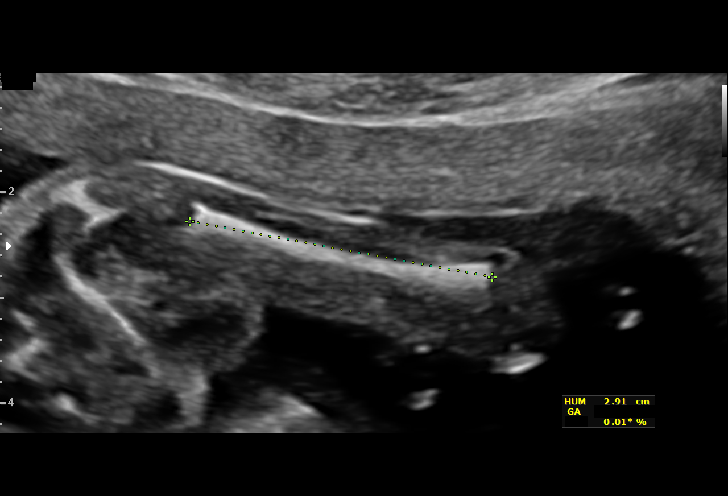
[im 45/76]
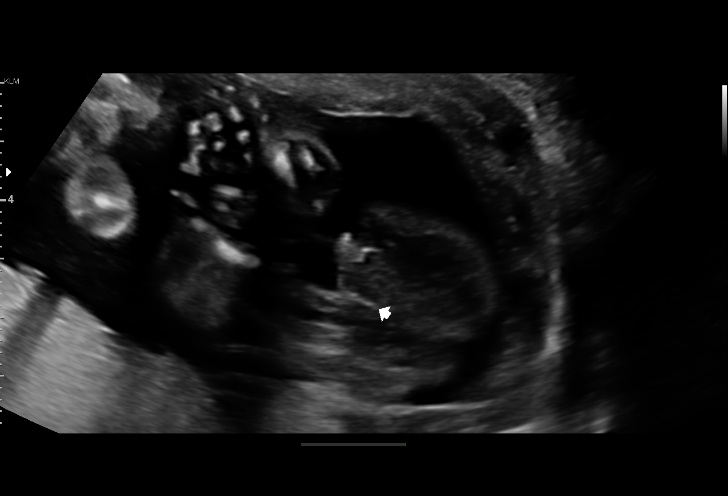
[im 51/76]
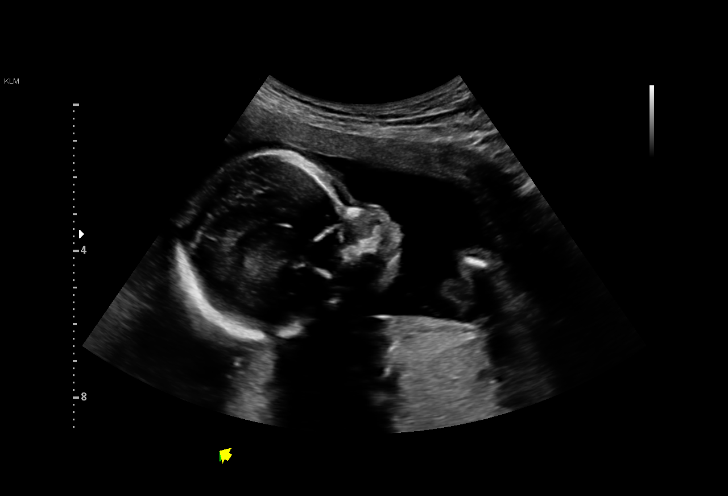
[im 56/76]
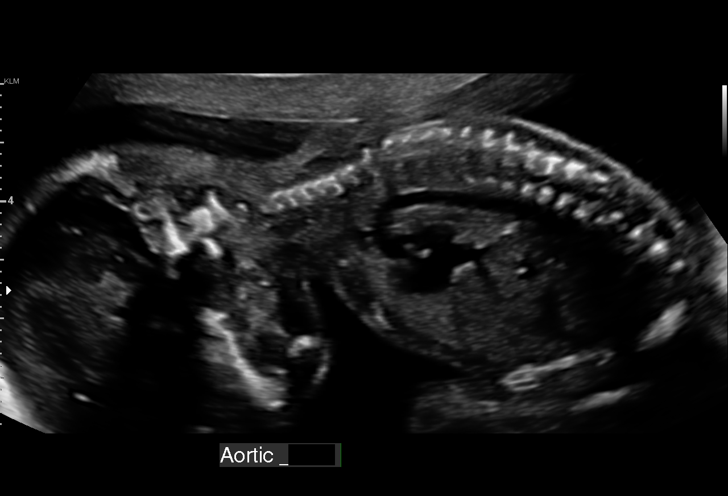
[im 62/76]
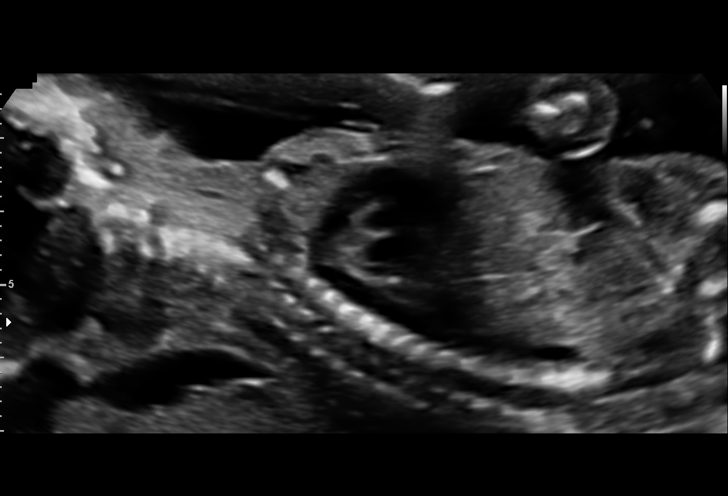
[im 67/76]
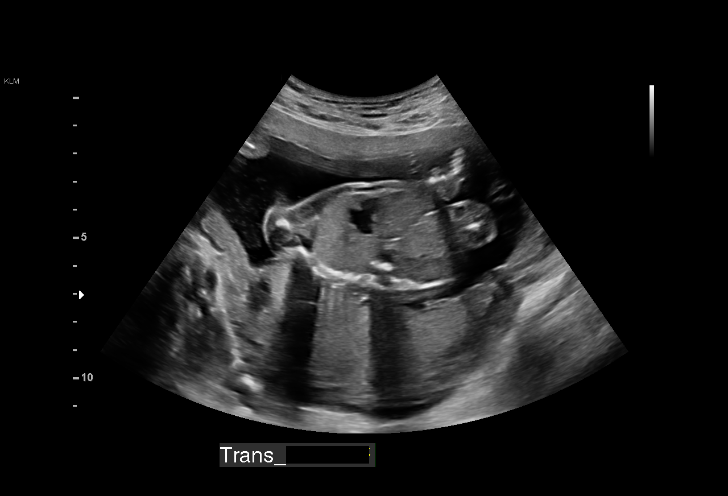
[im 73/76]
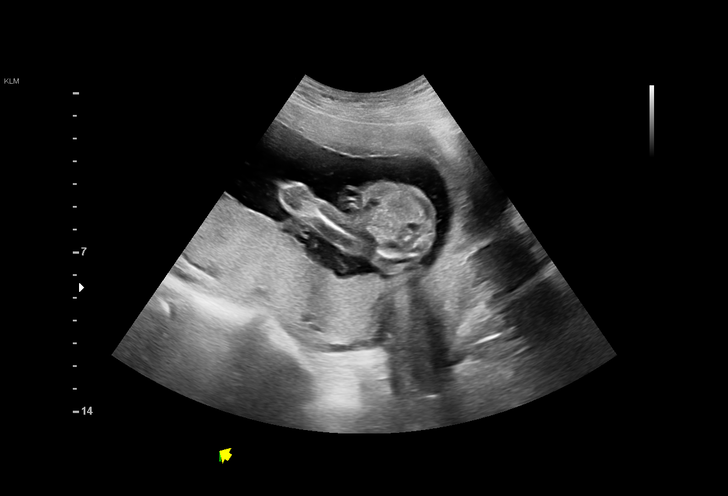

[13 of 28 positions shown; findings below may reference images not displayed]

[address]

 ----------------------------------------------------------------------

 ----------------------------------------------------------------------
Indications

  Advanced maternal age multigravida 35+,
  second trimester
  20 weeks gestation of pregnancy
  Placenta previa specified as without
  hemorrhage, second trimester
  Gestational diabetes in pregnancy,
  unspecified control
  Encounter for antenatal screening for
  malformations
  Encounter for uncertain dates
 ----------------------------------------------------------------------
Fetal Evaluation

 Num Of Fetuses:         1
 Fetal Heart Rate(bpm):  129
 Cardiac Activity:       Observed
 Presentation:           Variable
 Placenta:               Posterior Previa
 P. Cord Insertion:      Visualized

 Amniotic Fluid
 AFI FV:      Within normal limits
Biometry

 BPD:      46.9  mm     G. Age:  20w 1d         58  %    CI:        70.96   %    70 - 86
                                                         FL/HC:      17.5   %    16.8 -
 HC:      177.4  mm     G. Age:  20w 2d         52  %    HC/AC:      1.24        1.09 -
 AC:      143.4  mm     G. Age:  19w 5d         34  %    FL/BPD:     66.3   %
 FL:       31.1  mm     G. Age:  19w 5d         30  %    FL/AC:      21.7   %    20 - 24
 HUM:      29.1  mm     G. Age:  19w 3d         40  %
 CER:      21.1  mm     G. Age:  20w 1d         52  %

 Est. FW:     310  gm    0 lb 11 oz      31  %
OB History

 Gravidity:    4         Term:   3        Prem:   0        SAB:   0
 TOP:          0       Ectopic:  0        Living: 3
Gestational Age

 LMP:           26w 5d        Date:  02/08/19                 EDD:   11/15/19
 U/S Today:     20w 0d                                        EDD:   01/01/20
 Best:          20w 0d     Det. By:  U/S (08/14/19)           EDD:   01/01/20
Anatomy

 Cranium:               Appears normal         Aortic Arch:            Appears normal
 Cavum:                 Appears normal         Ductal Arch:            Appears normal
 Ventricles:            Appears normal         Diaphragm:              Appears normal
 Choroid Plexus:        Appears normal         Stomach:                Appears normal, left
                                                                       sided
 Cerebellum:            Appears normal         Abdomen:                Appears normal
 Posterior Fossa:       Appears normal         Abdominal Wall:         Appears nml (cord
                                                                       insert, abd wall)
 Nuchal Fold:           Not applicable (>20    Cord Vessels:           Appears normal (3
                        wks GA)                                        vessel cord)
 Face:                  Appears normal         Kidneys:                Appear normal
                        (orbits and profile)
 Lips:                  Appears normal         Bladder:                Appears normal
 Thoracic:              Appears normal         Spine:                  Appears normal
 Heart:                 Appears normal         Upper Extremities:      Appears normal
                        (4CH, axis, and
                        situs)
 RVOT:                  Appears normal         Lower Extremities:      Appears normal
 LVOT:                  Appears normal

 Other:  Fetus appears to be a male. Heels and 5th digit visualized. Nasal
         bone visualized. 3VV and 3VTV visualized.
Cervix Uterus Adnexa

 Cervix
 Length:              4  cm.
 Normal appearance by transabdominal scan.
Impression

 Ms. Rxn, Jhony Alexander4 P3, with unsure gestational age, is here for
 fetal anatomy scan. She had opted not to screen for fetal
 aneuploidies.

 We performed a fetal anatomy scan. No markers of
 aneuploidies or fetal structural defects are seen. Fetal
 biometry is consistent with 20-weeks' gestation. Amniotic fluid
 is normal and good fetal activity is seen.

 On transabdominal scan, placenta previa is seen. Patient
 does not give history of vaginal bleeding.
 I explained the findings and also the possibility of resolution
 of previa with advancing gestational age.

 Patient understands the limitations of ultrasound in detecting
 fetal anomalies.

 She has a recent diagnosis of gestational diabetes and will
 be meeting with diabetic educator.
 We have assigned her EDD at 01/01/2020.
Recommendations

 -An appointment was made for her to return in 4 weeks for
 fetal growth assessment.
                      [place], [place]

## 2021-05-28 ENCOUNTER — Other Ambulatory Visit: Payer: Self-pay | Admitting: Medical

## 2021-05-28 ENCOUNTER — Other Ambulatory Visit: Payer: Self-pay

## 2021-05-28 MED ORDER — PRENATAL VITAMIN PLUS LOW IRON 27-1 MG PO TABS: 1.0000 | ORAL_TABLET | ORAL | 12 refills | Status: DC

## 2021-07-04 DIAGNOSIS — H5213 Myopia, bilateral: Secondary | ICD-10-CM | POA: Diagnosis not present

## 2021-07-14 ENCOUNTER — Other Ambulatory Visit: Payer: Self-pay | Admitting: *Deleted

## 2021-07-14 DIAGNOSIS — D508 Other iron deficiency anemias: Secondary | ICD-10-CM

## 2021-07-14 DIAGNOSIS — L659 Nonscarring hair loss, unspecified: Secondary | ICD-10-CM

## 2021-07-14 MED ORDER — PRENATAL VITAMIN PLUS LOW IRON 27-1 MG PO TABS: 1.0000 | ORAL_TABLET | Freq: Every day | ORAL | 11 refills | Status: DC

## 2021-07-21 DIAGNOSIS — H5213 Myopia, bilateral: Secondary | ICD-10-CM | POA: Diagnosis not present

## 2021-09-02 IMAGING — US US MFM OB FOLLOW-UP
1 series · 12 of 28 positions shown · non-contrast
Comparison: none

[Series 1: us mfm ob follow-up · 60 acquisitions, 12 frames shown]
[im 3/60]
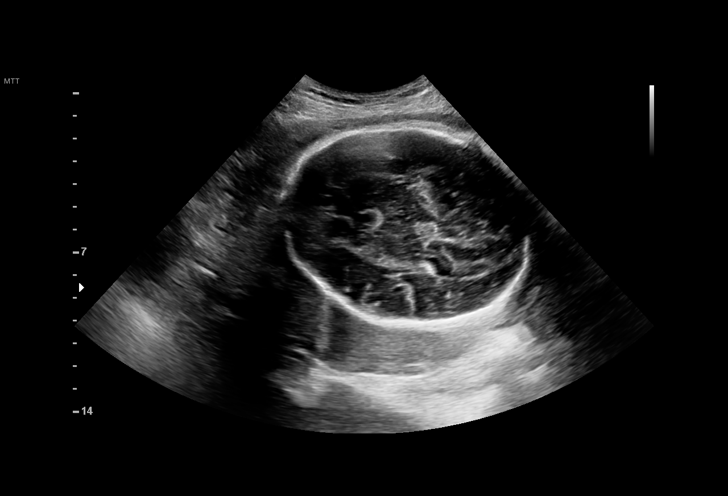
[im 7/60]
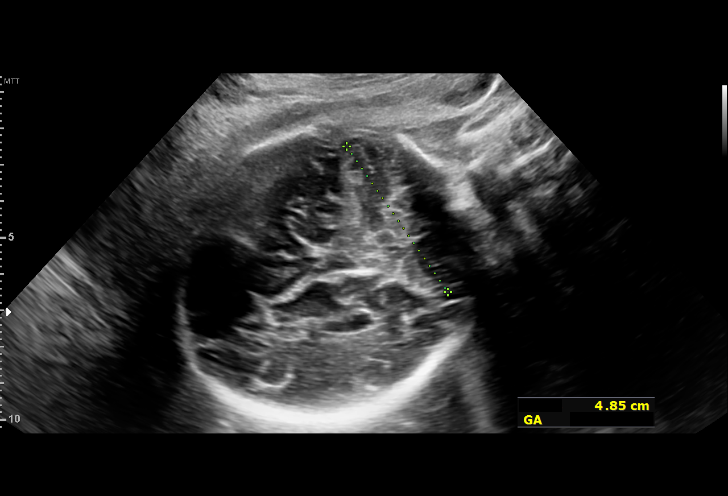
[im 11/60]
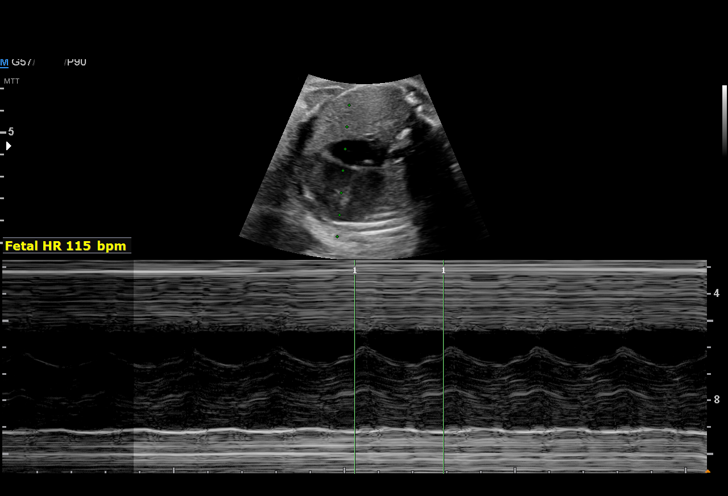
[im 18/60]
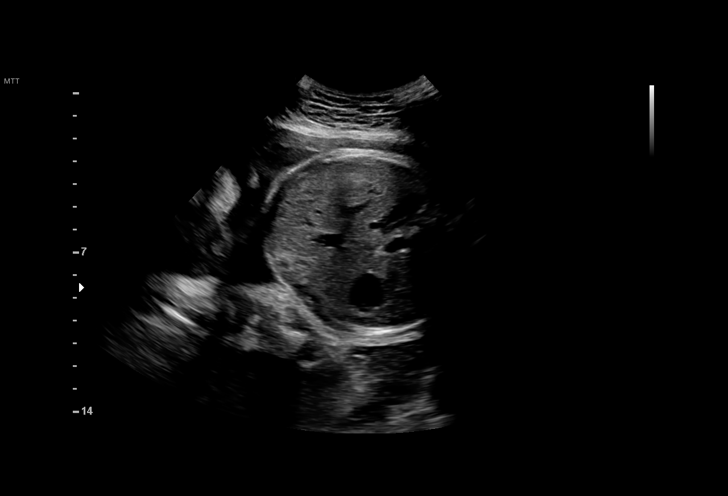
[im 22/60]
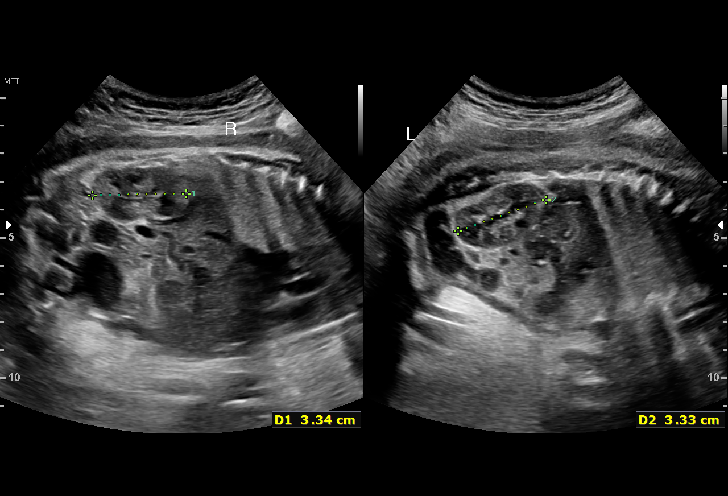
[im 27/60]
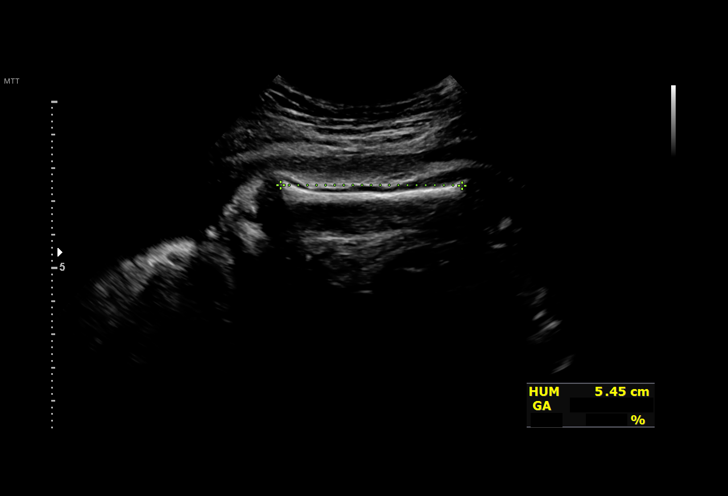
[im 33/60]
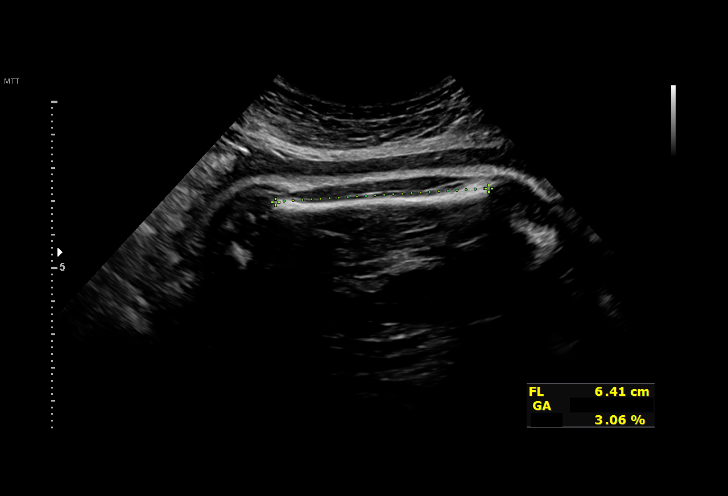
[im 38/60]
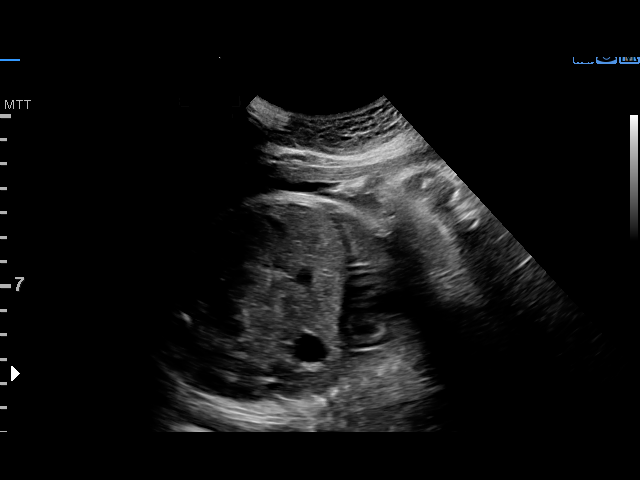
[im 42/60]
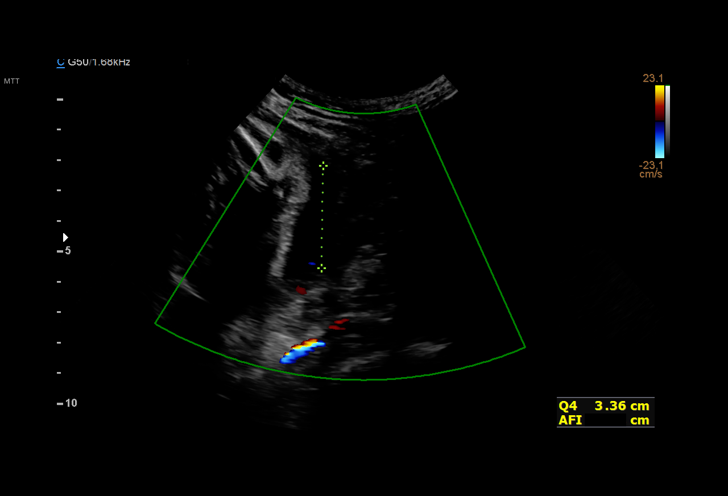
[im 49/60]
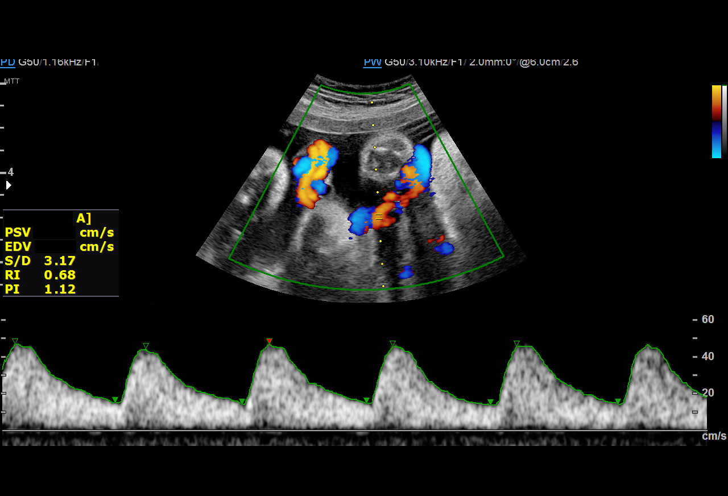
[im 53/60]
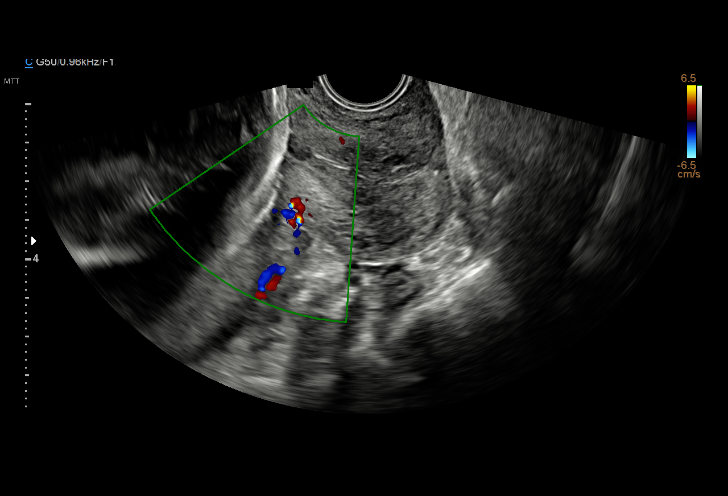
[im 57/60]
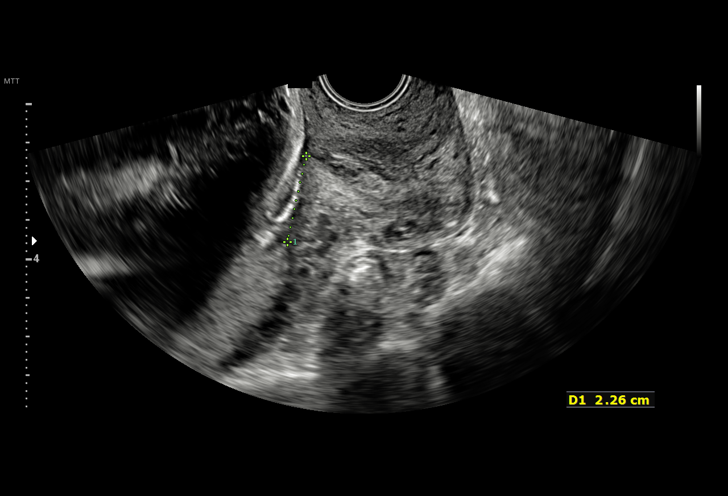

[12 of 28 positions shown; findings below may reference images not displayed]

[address]

 ----------------------------------------------------------------------

 ----------------------------------------------------------------------
Indications

  Placenta previa specified as without
  hemorrhage, third trimester
  Gestational diabetes in pregnancy, diet
  controlled
  35 weeks gestation of pregnancy
  Advanced maternal age multigravida 35+,
  third trimester
  Encounter for other antenatal screening
  follow-up
 ----------------------------------------------------------------------
Fetal Evaluation

 Num Of Fetuses:         1
 Fetal Heart Rate(bpm):  115
 Cardiac Activity:       Observed
 Presentation:           Cephalic
 Placenta:               No previa
 P. Cord Insertion:      Previously Visualized

 Amniotic Fluid
 AFI FV:      Within normal limits

 AFI Sum(cm)     %Tile       Largest Pocket(cm)
 11.08           29

 RUQ(cm)       RLQ(cm)       LUQ(cm)        LLQ(cm)

Biophysical Evaluation

 Amniotic F.V:   Pocket => 2 cm             F. Tone:        Observed
 F. Movement:    Observed                   Score:          [DATE]
 F. Breathing:   Observed
Biometry

 BPD:      80.8  mm     G. Age:  32w 3d          1  %    CI:        68.74   %    70 - 86
                                                         FL/HC:      20.4   %    20.1 -
 HC:      311.4  mm     G. Age:  34w 6d          8  %    HC/AC:      1.09        0.93 -
 AC:      286.1  mm     G. Age:  32w 4d          2  %    FL/BPD:     78.7   %    71 - 87
 FL:       63.6  mm     G. Age:  32w 6d          2  %    FL/AC:      22.2   %    20 - 24
 HUM:      54.4  mm     G. Age:  31w 4d        < 5  %
 CER:      48.5  mm     G. Age:  N/A          > 95  %

 LV:        4.9  mm

 Est. FW:    1447  gm      4 lb 9 oz      3  %
OB History

 Gravidity:    4         Term:   3        Prem:   0        SAB:   0
 TOP:          0       Ectopic:  0        Living: 3
Gestational Age

 LMP:           42w 2d        Date:  02/08/19                 EDD:   11/15/19
 U/S Today:     33w 1d                                        EDD:   01/18/20
 Best:          35w 4d     Det. By:  U/S  (08/14/19)          EDD:   01/01/20
Anatomy

 Cranium:               Previously seen        Aortic Arch:            Previously seen
 Cavum:                 Previously seen        Ductal Arch:            Previously seen
 Ventricles:            Appears normal         Diaphragm:              Appears normal
 Choroid Plexus:        Previously seen        Stomach:                Appears normal, left
                                                                       sided
 Cerebellum:            Previously seen        Abdomen:                Previously seen
 Posterior Fossa:       Previously seen        Abdominal Wall:         Previously seen
 Nuchal Fold:           Not applicable (>20    Cord Vessels:           Previously seen
                        wks GA)
 Face:                  Orbits and profile     Kidneys:                Appear normal
                        previously seen
 Lips:                  Previously seen        Bladder:                Appears normal
 Thoracic:              Previously seen        Spine:                  Previously seen
 Heart:                 Appears normal         Upper Extremities:      Previously seen
                        (4CH, axis, and
                        situs)
 RVOT:                  Previously seen        Lower Extremities:      Previously seen
 LVOT:                  Appears normal

 Other:  Male gender previously seen. Heels and 5th digit visualized
         previously. Nasal bone visualized previously. 3VV and 3VTV
         visualized previously.
Doppler - Fetal Vessels

 Umbilical Artery
  S/D     %tile     RI              PI                     ADFV    RDFV
 3.16       87   0.68             1.13                        No      No
Cervix Uterus Adnexa

 Cervix
 Not visualized (advanced GA >89wks)

 Uterus
 No abnormality visualized.
Comments

 This patient was seen for a follow up growth scan due to A1
 gestational diabetes and placenta previa that was noted
 during her prior ultrasound exams.  She denies any problems
 since her last exam and reports feeling vigorous fetal
 movements throughout the day.
 On today's exam, the EFW measures at the 3rd percentile for
 her gestational age indicating fetal growth restriction.  The
 fetus has grown over 1 pound over the past 3 weeks.  There
 was normal amniotic fluid noted.
 A biophysical profile performed today due to fetal growth
 restriction was [DATE].
 Doppler studies of the umbilical arteries showed a normal
 S/D ratio of 3.16.  There were no signs of absent or reversed
 end-diastolic flow.
 A transvaginal ultrasound performed today shows that the
 posterior placenta is approximately 2 cm away from the
 internal cervical os.  The edge of the placenta was difficult to
 visualize as the fetal head was right up against the cervix.
 The patient was advised that we will repeat another
 transvaginal ultrasound again next week.  Based on today's
 ultrasound, the patient may attempt a vaginal delivery at term
 and will likely not require a cesarean delivery.
 Due to fetal growth restriction, we will continue to follow her
 with weekly fetal testing and umbilical artery Doppler studies.
 The reports that all of her children were small. Her last child
 was delivered at 38 weeks weighing 5 pounds 1 ounces.
 Due to fetal growth restriction, delivery may be considered at
 between 37 to 38 weeks.

## 2021-10-08 ENCOUNTER — Other Ambulatory Visit: Payer: Self-pay

## 2021-10-08 ENCOUNTER — Ambulatory Visit: Payer: Medicaid Other | Admitting: Family Medicine

## 2021-10-08 VITALS — BP 97/76 | HR 78 | Ht 63.0 in | Wt 126.4 lb

## 2021-10-08 DIAGNOSIS — R109 Unspecified abdominal pain: Secondary | ICD-10-CM

## 2021-10-08 DIAGNOSIS — R1084 Generalized abdominal pain: Secondary | ICD-10-CM

## 2021-10-08 LAB — POCT UA - MICROSCOPIC ONLY: Epithelial cells, urine per micros: >20

## 2021-10-08 LAB — POCT URINALYSIS DIP (CLINITEK)
Bilirubin, UA: NEGATIVE
Blood, UA: NEGATIVE
Glucose, UA: NEGATIVE mg/dL
Ketones, POC UA: NEGATIVE mg/dL
Nitrite, UA: NEGATIVE
POC PROTEIN,UA: NEGATIVE
Spec Grav, UA: 1.01 (ref 1.010–1.025)
Urobilinogen, UA: 0.2 E.U./dL
pH, UA: 5.5 (ref 5.0–8.0)

## 2021-10-08 LAB — POCT URINE PREGNANCY: Preg Test, Ur: NEGATIVE

## 2021-10-08 NOTE — Progress Notes (Signed)
° ° °  SUBJECTIVE:   CHIEF COMPLAINT / HPI: migrating abdominal pain  Started 3 days ago in right upper quadrant. Radiated to umbilical area and right lower quadrant.  Denies any fevers, trauma, nausea, vomiting, decrease in appetite, vaginal bleeding, dysuria, hematuria, diarrhea, constipation or bloody stool.  Endorses had some vaginal discharge last week and itching but has since resolved.  LMP 12/23. Sexually active with 1 partner uses condoms. LBM today and has been regular.  Has had history of constipation in the past but reports no hard stool now.  PERTINENT  PMH / PSH:  Umbilical hernia  OBJECTIVE:   BP 97/76    Pulse 78    Ht 5\' 3"  (1.6 m)    Wt 126 lb 6.4 oz (57.3 kg)    LMP 09/17/2021 (Exact Date)    SpO2 100%    BMI 22.39 kg/m    General: Alert, no acute distress Cardio: Normal S1 and S2, RRR, no r/m/g Pulm: CTAB, normal work of breathing Abdomen: Bowel sounds normal. Positive Murphy's, Positive Rebound, Negative Rovsign. Negative Carnetts'.  No CVA tenderness. Periumbilical and suprapubic pain elicited on deep palpation. No palpable hernia   ASSESSMENT/PLAN:   Abdominal pain Unclear etiology of acute abdomen.  Considered biliary, hepatic, gastric, gynecological, renal, colonic causes.  Abdominal exam acutely tender on deep palpation.   -UPT negative -U/A and urine culture -CBC,CMet, Lipase stat -Ferritin -RUQ u/s stat -CT abd w/wo contrast -Strict return precautions provided and patient aware if any worsening of symptoms to go to ED for further evaluation.    Carollee Leitz, MD [place]

## 2021-10-08 NOTE — Patient Instructions (Addendum)
Thank you for coming to see me today. It was a pleasure.   Your pregnancy test is negative  Will get some labs today and imaging of you belly.  I am concerned that this may be the beginning of a gallbladder infection or infection of the appendix.  If you develop any worsening symptoms overnight go to the Emergency department to be evaluated.  Please follow-up with   If you have any questions or concerns, please do not hesitate to call the office at [phone].  Best,   Carollee Leitz, MD

## 2021-10-09 ENCOUNTER — Ambulatory Visit (HOSPITAL_COMMUNITY)
Admission: RE | Admit: 2021-10-09 | Discharge: 2021-10-09 | Disposition: A | Discharge status: Home / Self Care | Payer: Medicaid Other | Source: Ambulatory Visit | Attending: Family Medicine | Admitting: Family Medicine

## 2021-10-09 ENCOUNTER — Telehealth: Payer: Self-pay

## 2021-10-09 ENCOUNTER — Encounter: Payer: Self-pay | Admitting: Family Medicine

## 2021-10-09 ENCOUNTER — Telehealth: Payer: Self-pay | Admitting: Family Medicine

## 2021-10-09 DIAGNOSIS — R109 Unspecified abdominal pain: Secondary | ICD-10-CM | POA: Insufficient documentation

## 2021-10-09 DIAGNOSIS — R1011 Right upper quadrant pain: Secondary | ICD-10-CM | POA: Diagnosis not present

## 2021-10-09 HISTORY — DX: Unspecified abdominal pain: R10.9

## 2021-10-09 LAB — LIPASE: Lipase: 45 U/L (ref 14–72)

## 2021-10-09 LAB — COMPREHENSIVE METABOLIC PANEL
ALT: 22 IU/L (ref 0–32)
AST: 26 IU/L (ref 0–40)
Albumin/Globulin Ratio: 1.6 (ref 1.2–2.2)
Albumin: 4.3 g/dL (ref 3.8–4.8)
Alkaline Phosphatase: 82 IU/L (ref 44–121)
BUN/Creatinine Ratio: 19 (ref 9–23)
BUN: 12 mg/dL (ref 6–24)
Bilirubin Total: 0.2 mg/dL (ref 0.0–1.2)
CO2: 24 mmol/L (ref 20–29)
Calcium: 9.2 mg/dL (ref 8.7–10.2)
Chloride: 106 mmol/L (ref 96–106)
Creatinine, Ser: 0.63 mg/dL (ref 0.57–1.00)
Globulin, Total: 2.7 g/dL (ref 1.5–4.5)
Glucose: 96 mg/dL (ref 70–99)
Potassium: 4.1 mmol/L (ref 3.5–5.2)
Sodium: 138 mmol/L (ref 134–144)
Total Protein: 7 g/dL (ref 6.0–8.5)
eGFR: 114 mL/min/{1.73_m2} (ref >59)

## 2021-10-09 LAB — CBC
Hematocrit: 33.7 % — ABNORMAL LOW (ref 34.0–46.6)
Hemoglobin: 11 g/dL — ABNORMAL LOW (ref 11.1–15.9)
MCH: 25.4 pg — ABNORMAL LOW (ref 26.6–33.0)
MCHC: 32.6 g/dL (ref 31.5–35.7)
MCV: 78 fL — ABNORMAL LOW (ref 79–97)
Platelets: 299 10*3/uL (ref 150–450)
RBC: 4.33 x10E6/uL (ref 3.77–5.28)
RDW: 16.2 % — ABNORMAL HIGH (ref 11.7–15.4)
WBC: 8.2 10*3/uL (ref 3.4–10.8)

## 2021-10-09 NOTE — Assessment & Plan Note (Signed)
Unclear etiology of acute abdomen.  Considered biliary, hepatic, gastric, gynecological, renal, colonic causes.  Abdominal exam acutely tender on deep palpation.   -UPT negative -U/A and urine culture -CBC,CMet, Lipase stat -Ferritin -RUQ u/s stat -CT abd w/wo contrast -Strict return precautions provided and patient aware if any worsening of symptoms to go to ED for further evaluation.

## 2021-10-09 NOTE — Telephone Encounter (Signed)
Called pt and informed her of the CT appt at [facility] Friday 1-13 at 730 am. Informed pt she will need to go and pick up the contrast today. Informed pt to not eat or drink after midnight. Pt understood. Jessica Ballard, CMA   [place], Norwood Levo, MD  P [address]; [place], [place], [place] Please call tomorrow and scheduled stat CT abd/pelvis with contrast for patient. May need PA but ruling out acute appendicitis.   Thanks!  Dr Thompson Grayer

## 2021-10-09 NOTE — Telephone Encounter (Signed)
Discussed results of ultrasound and lab work with patient. CT abd/pelvis scheduled for tomorrow.  Will continue to follow  Carollee Leitz, MD [facility]

## 2021-10-10 ENCOUNTER — Encounter: Payer: Self-pay | Admitting: Family Medicine

## 2021-10-10 ENCOUNTER — Other Ambulatory Visit: Payer: Self-pay

## 2021-10-10 ENCOUNTER — Ambulatory Visit (HOSPITAL_COMMUNITY)
Admission: RE | Admit: 2021-10-10 | Discharge: 2021-10-10 | Disposition: A | Discharge status: Home / Self Care | Payer: Medicaid Other | Source: Ambulatory Visit | Attending: Family Medicine | Admitting: Family Medicine

## 2021-10-10 DIAGNOSIS — R109 Unspecified abdominal pain: Secondary | ICD-10-CM | POA: Diagnosis not present

## 2021-10-10 MED ORDER — IOHEXOL 300 MG/ML  SOLN
80.0000 mL | Freq: Once | INTRAMUSCULAR | Status: AC | PRN
  Administered 2021-10-10: 80 mL via INTRAVENOUS

## 2021-10-10 MED ORDER — SODIUM CHLORIDE (PF) 0.9 % IJ SOLN
INTRAMUSCULAR | Status: AC
  Filled 2021-10-10: qty 50

## 2021-10-11 LAB — URINE CULTURE

## 2021-10-14 ENCOUNTER — Encounter: Payer: Self-pay | Admitting: Family Medicine

## 2022-02-09 ENCOUNTER — Encounter: Payer: Self-pay | Admitting: Family Medicine

## 2022-02-09 ENCOUNTER — Ambulatory Visit (INDEPENDENT_AMBULATORY_CARE_PROVIDER_SITE_OTHER): Payer: Medicaid Other | Admitting: Family Medicine

## 2022-02-09 VITALS — BP 123/95 | HR 73 | Temp 98.4°F | Wt 123.1 lb

## 2022-02-09 DIAGNOSIS — L03012 Cellulitis of left finger: Secondary | ICD-10-CM

## 2022-02-09 MED ORDER — MUPIROCIN 2 % EX OINT: 1.0000 "application " | TOPICAL_OINTMENT | Freq: Three times a day (TID) | CUTANEOUS | 0 refills | Status: DC

## 2022-02-09 NOTE — Patient Instructions (Signed)
We are going to treat this like a paronychia, which is a very common infection of the finger.  I am sending in mupirocin ointment for treatment.  You can use this 3 times a day after warm water soaks of your finger.  We will do this for 7 days ? ?If not several days, it becomes worse, you feel an abscess forming, or it is not improving please let me know. ?

## 2022-02-09 NOTE — Progress Notes (Signed)
? ? ?  SUBJECTIVE:  ? ?CHIEF COMPLAINT / HPI:  ? ?Patient reports pain in her left ring finger that is been present for the last 2 days but starting yesterday it became more painful and red.  She does note that she gardens, she does not typically mess with her cuticles for bite on her fingernails. ? ?PERTINENT  PMH / PSH: Reviewed ? ?OBJECTIVE:  ? ?BP (!) 123/95   Pulse 73   Temp 98.4 ?F (36.9 ?C) (Oral)   Wt 123 lb 2 oz (55.8 kg)   LMP 02/08/2022   SpO2 100%   BMI 21.81 kg/m?   ?General: NAD, well-appearing, well-nourished ?Respiratory: No respiratory distress, breathing comfortably, able to speak in full sentences ?Skin: warm and dry, no rashes noted on exposed skin ?Psych: Appropriate affect and mood ?Extremities: Lateral fold of the left ring finger with erythema and TTP. Nontender over the nailbed, no identifiable fluid collection.  ? ?ASSESSMENT/PLAN:  ? ?Left ring finger paronychia ?Physical exam consistent with paronychia, nothing present that is obvious to drain.  We will treat with topical antibiotics. ?- Mupirocin 2% ointment 3 times daily ?- Warm water soaks before antibiotic administration ?- Follow-up in the next 5 to 7 days if no improvement, then would consider oral antibiotic ?- Return precautions if abscess formation ? ?Revis Whalin, DO ?[place]  ?

## 2022-03-03 ENCOUNTER — Encounter: Payer: Self-pay | Admitting: *Deleted

## 2022-07-24 ENCOUNTER — Encounter: Payer: Self-pay | Admitting: Student

## 2022-07-24 ENCOUNTER — Ambulatory Visit (INDEPENDENT_AMBULATORY_CARE_PROVIDER_SITE_OTHER): Payer: Medicaid Other | Admitting: Student

## 2022-07-24 VITALS — BP 98/74 | HR 75 | Ht 64.0 in | Wt 120.6 lb

## 2022-07-24 DIAGNOSIS — Z0000 Encounter for general adult medical examination without abnormal findings: Secondary | ICD-10-CM | POA: Diagnosis not present

## 2022-07-24 DIAGNOSIS — D508 Other iron deficiency anemias: Secondary | ICD-10-CM | POA: Diagnosis not present

## 2022-07-24 DIAGNOSIS — L659 Nonscarring hair loss, unspecified: Secondary | ICD-10-CM

## 2022-07-24 DIAGNOSIS — Z833 Family history of diabetes mellitus: Secondary | ICD-10-CM

## 2022-07-24 MED ORDER — PRENATAL VITAMIN PLUS LOW IRON 27-1 MG PO TABS: 1.0000 | ORAL_TABLET | Freq: Every day | ORAL | 12 refills | Status: AC

## 2022-07-24 NOTE — Patient Instructions (Signed)
It was great to see you today! Thank you for choosing [facility] for your primary care.   Today we addressed:    You should return to our clinic No follow-ups on file.  I recommend that you always bring your medications to each appointment as this makes it easy to ensure you are on the correct medications and helps [place] not miss refills when you need them.  Please arrive 15 minutes before your appointment to ensure smooth check in process.  We appreciate your efforts in making this happen.  Please call the clinic at [phone] if your symptoms worsen or you have any concerns.  Thank you for allowing me to participate in your care, Dr. Sabra Heck

## 2022-07-24 NOTE — Assessment & Plan Note (Signed)
Flu shot did not work She is to schedule a Pap smear in November

## 2022-07-24 NOTE — Assessment & Plan Note (Signed)
Restart prenatal vitamin Recheck CBC in 6 months

## 2022-07-24 NOTE — Progress Notes (Signed)
    SUBJECTIVE:   CHIEF COMPLAINT / HPI:   Jessica Ballard is a 41 y.o. female presenting for an annual visit. She is concerned about her hair shedding more frequently.  She has not noticed any large clumps or large patches of hair falling out at once.  She noticed that prenatal vitamins seem to help.  Pap smear due: 07/2022  Flu shot given at work.   No prior history of Gestational diabetes - entered in error on the chart  Last A1C 5.6 10/2020  PERTINENT  PMH / PSH: iron deficiency anemia, abdominal pain  OBJECTIVE:   BP 98/74   Pulse 75   Ht '5\' 4"'$  (1.626 m)   Wt 120 lb 9.6 oz (54.7 kg)   LMP 07/18/2022   SpO2 98%   Breastfeeding No   BMI 20.70 kg/m   Well-appearing, well-nourished no acute distress Cardio: Regular rate, regular rhythm, no murmurs on exam. Pulm: Clear, no wheezing, no crackles. No increased work of breathing Abdominal: bowel sounds present, soft, non-tender, non-distended Extremities: no peripheral edema  Skin: scalp shows no evidence of female patterned hair loss  ASSESSMENT/PLAN:   Healthcare maintenance Flu shot did not work She is to schedule a Pap smear in November  IDA (iron deficiency anemia) Restart prenatal vitamin Recheck CBC in 6 months   Hair loss: Most likely normal progression of postpartum period.  No evidence of female pattern hair loss.  We will send in prenatal vitamin since this seems to help with her hair as well.  Darci Current, [place]

## 2024-06-01 ENCOUNTER — Encounter: Admitting: Student

## 2024-06-05 ENCOUNTER — Encounter: Admitting: Student

## 2024-10-06 ENCOUNTER — Encounter: Payer: Self-pay | Admitting: Family Medicine

## 2024-10-06 ENCOUNTER — Ambulatory Visit: Payer: Self-pay

## 2024-10-06 ENCOUNTER — Ambulatory Visit: Admitting: Family Medicine

## 2024-10-06 VITALS — BP 103/72 | HR 77 | Ht 64.0 in | Wt 118.0 lb

## 2024-10-06 DIAGNOSIS — Z1159 Encounter for screening for other viral diseases: Secondary | ICD-10-CM | POA: Diagnosis not present

## 2024-10-06 DIAGNOSIS — Z833 Family history of diabetes mellitus: Secondary | ICD-10-CM | POA: Diagnosis not present

## 2024-10-06 DIAGNOSIS — D508 Other iron deficiency anemias: Secondary | ICD-10-CM

## 2024-10-06 DIAGNOSIS — Z8632 Personal history of gestational diabetes: Secondary | ICD-10-CM

## 2024-10-06 DIAGNOSIS — Z1231 Encounter for screening mammogram for malignant neoplasm of breast: Secondary | ICD-10-CM | POA: Diagnosis not present

## 2024-10-06 LAB — POCT GLYCOSYLATED HEMOGLOBIN (HGB A1C): Hemoglobin A1C: 5.8 % — AB (ref 4.0–5.6)

## 2024-10-06 NOTE — Progress Notes (Signed)
" ° °  SUBJECTIVE:   CHIEF COMPLAINT / HPI: Physical  Nose concern - L side of nose gets sore and bruised intermittently. Unknown trigger. Burns. With some rash, red, blistering. No vision changes. No rash anywhere else. Some dry skin with colder weather. Going on >1 year but worse recently. Has currently. Started few days ago. Washes face and moisturizes regularly. No changes in soaps, lotions. Puts vaseline or castor oil on it when it comes up, helps.  Reproductive History - [id number] - Menses: regular, q21-24 days, lasts 5 days, heavy first few days - LMP: 09/17/24 - Contraception: condoms - Cancer screening: due, declines today - currently sexually active with one female partner(s) - denies abnormal vaginal discharge, rashes, ulcers  H/o IDA - while pregnant, also with h/o heavy periods  Family history of diabetes, HTN. Mom passed at age 25yo due to diabetic complications. No known FH of breast, colon cancer.   Does not smoke.    OBJECTIVE:   BP 103/72   Pulse 77   Ht 5' 4 (1.626 m)   Wt 118 lb (53.5 kg)   SpO2 100%   BMI 20.25 kg/m   Gen: well appearing, in NAD HEENT: orophyarynx clear without exudate or erythema. Uvula midline. No tonsillar enlargement. Good dentition. TM visible b/l without bulging, erythema, purulence. No cervical or supraclavicular lymphadenopathy. Slight fissure noted to medial L nare with erythema, no bleeding. No blisters, ulcers, other rash. Boggy and erythematous nasal turbinates. Card: RRR Lungs: CTAB Abd: soft, NTND, +BS Ext: WWP, no edema  ASSESSMENT/PLAN:   Assessment & Plan History of gestational diabetes  Other iron  deficiency anemia Likely from chronic blood loss. Recheck labs.  Screening mammogram for breast cancer  Need for hepatitis B screening test  Family history of diabetes mellitus type II A1c today.   Annual Examination  See AVS for age appropriate recommendations.  Blood pressure reviewed and at goal.  The patient  currently uses condoms for contraception.   Considered the following items based upon USPSTF recommendations: Diabetes screening: ordered HIV testing:recently completed and result reviewed, normal  Hepatitis C: recently completed and result reviewed, normal  Hepatitis B:ordered Syphilis if at high risk: discussed and declined GC/CT not at high risk and not ordered. Lipid panel (nonfasting or fasting) discussed based upon AHA recommendations and ordered.  Consider repeat every 4-6 years.  Reviewed risk factors for latent tuberculosis and not indicated   Discussed family history, BRCA testing not indicated.  Cervical cancer screening: discussed and declined. Will make f/u appt soon. Breast cancer screening: ordered Colorectal cancer screening: not applicable given age.  if age 41 or over.   Follow up in 1 year for CPE, sooner for pap smear. MyChart Activation: Already signed up  Donald CHRISTELLA Lai, DO [place] [facility]    "

## 2024-10-06 NOTE — Assessment & Plan Note (Addendum)
 A1c today.

## 2024-10-06 NOTE — Patient Instructions (Addendum)
 It was great to see you!  Our plans for today:  - We ordered a screening mammogram for you today. You can call [facility] at [phone] to schedule this.  - Make an appointment soon to update your pap smear.  - Use vaseline on your nose. This is likely from trauma from nose wiping due to your allergies. - Use flonase daily to control your allergies.  - You will be due for colon cancer screening next year.   We are checking some labs today, we will release these results to your MyChart.  Take care and seek immediate care sooner if you develop any concerns.   Dr. Breona Cherubin

## 2024-10-06 NOTE — Assessment & Plan Note (Addendum)
 Likely from chronic blood loss. Recheck labs.

## 2024-10-07 LAB — CBC
Hematocrit: 35.4 % (ref 34.0–46.6)
Hemoglobin: 10.5 g/dL — ABNORMAL LOW (ref 11.1–15.9)
MCH: 22.4 pg — ABNORMAL LOW (ref 26.6–33.0)
MCHC: 29.7 g/dL — ABNORMAL LOW (ref 31.5–35.7)
MCV: 76 fL — ABNORMAL LOW (ref 79–97)
Platelets: 347 x10E3/uL (ref 150–450)
RBC: 4.69 x10E6/uL (ref 3.77–5.28)
RDW: 15.4 % (ref 11.7–15.4)
WBC: 7.1 x10E3/uL (ref 3.4–10.8)

## 2024-10-07 LAB — LIPID PANEL
Chol/HDL Ratio: 4 ratio (ref 0.0–4.4)
Cholesterol, Total: 165 mg/dL (ref 100–199)
HDL: 41 mg/dL
LDL Chol Calc (NIH): 112 mg/dL — ABNORMAL HIGH (ref 0–99)
Triglycerides: 63 mg/dL (ref 0–149)
VLDL Cholesterol Cal: 12 mg/dL (ref 5–40)

## 2024-10-07 LAB — BASIC METABOLIC PANEL WITH GFR
BUN/Creatinine Ratio: 12 (ref 9–23)
BUN: 10 mg/dL (ref 6–24)
CO2: 19 mmol/L — ABNORMAL LOW (ref 20–29)
Calcium: 9.1 mg/dL (ref 8.7–10.2)
Chloride: 104 mmol/L (ref 96–106)
Creatinine, Ser: 0.82 mg/dL (ref 0.57–1.00)
Glucose: 95 mg/dL (ref 70–99)
Potassium: 4.5 mmol/L (ref 3.5–5.2)
Sodium: 136 mmol/L (ref 134–144)
eGFR: 90 mL/min/1.73

## 2024-10-07 LAB — HEPATITIS B CORE ANTIBODY, IGM: Hep B C IgM: NEGATIVE

## 2024-10-07 LAB — HEPATITIS B SURFACE ANTIBODY,QUALITATIVE: Hep B Surface Ab, Qual: NONREACTIVE

## 2024-10-07 LAB — HEPATITIS B SURFACE ANTIGEN: Hepatitis B Surface Ag: NEGATIVE

## 2024-10-09 ENCOUNTER — Ambulatory Visit: Payer: Self-pay | Admitting: Family Medicine

## 2024-10-09 DIAGNOSIS — D508 Other iron deficiency anemias: Secondary | ICD-10-CM

## 2024-10-25 MED ORDER — PRENATAL VITAMIN 27-0.8 MG PO TABS: 1.0000 | ORAL_TABLET | Freq: Every day | ORAL | 3 refills | Status: AC
